# Patient Record
Sex: Female | Born: 1940
Health system: Southern US, Community
[De-identification: ages and names within clinical notes are randomized; demographics above are authoritative.]

---

## 2000-06-07 ENCOUNTER — Emergency Department (HOSPITAL_COMMUNITY): Admission: EM | Admit: 2000-06-07 | Discharge: 2000-06-07 | Payer: Self-pay | Admitting: Emergency Medicine

## 2001-01-03 ENCOUNTER — Encounter: Payer: Self-pay | Admitting: Family Medicine

## 2001-01-03 ENCOUNTER — Encounter: Admission: RE | Admit: 2001-01-03 | Discharge: 2001-01-03 | Payer: Self-pay | Admitting: Family Medicine

## 2001-08-28 ENCOUNTER — Encounter: Admission: RE | Admit: 2001-08-28 | Discharge: 2001-08-28 | Payer: Self-pay | Admitting: Family Medicine

## 2001-08-28 ENCOUNTER — Encounter: Payer: Self-pay | Admitting: Family Medicine

## 2002-01-30 ENCOUNTER — Encounter: Payer: Self-pay | Admitting: Family Medicine

## 2002-01-30 ENCOUNTER — Encounter: Admission: RE | Admit: 2002-01-30 | Discharge: 2002-01-30 | Payer: Self-pay | Admitting: Family Medicine

## 2003-05-30 ENCOUNTER — Encounter: Admission: RE | Admit: 2003-05-30 | Discharge: 2003-05-30 | Payer: Self-pay | Admitting: Family Medicine

## 2003-11-07 ENCOUNTER — Other Ambulatory Visit: Admission: RE | Admit: 2003-11-07 | Discharge: 2003-11-07 | Payer: Self-pay | Admitting: Family Medicine

## 2003-12-11 ENCOUNTER — Ambulatory Visit (HOSPITAL_COMMUNITY): Admission: RE | Admit: 2003-12-11 | Discharge: 2003-12-11 | Payer: Self-pay | Admitting: Gastroenterology

## 2003-12-11 ENCOUNTER — Encounter (INDEPENDENT_AMBULATORY_CARE_PROVIDER_SITE_OTHER): Payer: Self-pay | Admitting: Specialist

## 2004-06-02 ENCOUNTER — Encounter: Admission: RE | Admit: 2004-06-02 | Discharge: 2004-06-02 | Payer: Self-pay | Admitting: Family Medicine

## 2004-11-27 ENCOUNTER — Other Ambulatory Visit: Admission: RE | Admit: 2004-11-27 | Discharge: 2004-11-27 | Payer: Self-pay | Admitting: Family Medicine

## 2005-06-17 ENCOUNTER — Encounter: Admission: RE | Admit: 2005-06-17 | Discharge: 2005-06-17 | Payer: Self-pay | Admitting: Family Medicine

## 2005-06-29 ENCOUNTER — Other Ambulatory Visit: Admission: RE | Admit: 2005-06-29 | Discharge: 2005-06-29 | Payer: Self-pay | Admitting: Physician Assistant

## 2005-12-02 ENCOUNTER — Other Ambulatory Visit: Admission: RE | Admit: 2005-12-02 | Discharge: 2005-12-02 | Payer: Self-pay | Admitting: Family Medicine

## 2006-06-20 ENCOUNTER — Encounter: Admission: RE | Admit: 2006-06-20 | Discharge: 2006-06-20 | Payer: Self-pay | Admitting: Family Medicine

## 2006-12-06 ENCOUNTER — Other Ambulatory Visit: Admission: RE | Admit: 2006-12-06 | Discharge: 2006-12-06 | Payer: Self-pay | Admitting: Family Medicine

## 2007-06-23 ENCOUNTER — Encounter: Admission: RE | Admit: 2007-06-23 | Discharge: 2007-06-23 | Payer: Self-pay | Admitting: Family Medicine

## 2007-12-19 ENCOUNTER — Other Ambulatory Visit: Admission: RE | Admit: 2007-12-19 | Discharge: 2007-12-19 | Payer: Self-pay | Admitting: Family Medicine

## 2008-06-27 ENCOUNTER — Encounter: Admission: RE | Admit: 2008-06-27 | Discharge: 2008-06-27 | Payer: Self-pay | Admitting: Family Medicine

## 2009-01-30 ENCOUNTER — Encounter: Admission: RE | Admit: 2009-01-30 | Discharge: 2009-01-30 | Payer: Self-pay | Admitting: Family Medicine

## 2009-07-08 ENCOUNTER — Encounter: Admission: RE | Admit: 2009-07-08 | Discharge: 2009-07-08 | Payer: Self-pay | Admitting: Family Medicine

## 2010-05-25 ENCOUNTER — Encounter
Admission: RE | Admit: 2010-05-25 | Discharge: 2010-05-25 | Payer: Self-pay | Source: Home / Self Care | Attending: General Surgery | Admitting: General Surgery

## 2010-07-13 ENCOUNTER — Encounter
Admission: RE | Admit: 2010-07-13 | Discharge: 2010-07-13 | Payer: Self-pay | Source: Home / Self Care | Attending: Family Medicine | Admitting: Family Medicine

## 2010-10-30 NOTE — Op Note (Signed)
NAME:  Jennifer Valenzuela, Jennifer Valenzuela                         ACCOUNT NO.:  [id number]   MEDICAL RECORD NO.:  [id number]                   PATIENT TYPE:  AMB   LOCATION:  ENDO                                 FACILITY:  [facility] [place] [facility]   PHYSICIAN:  Graylin Shiver, M.D.                DATE OF BIRTH:  02-Jan-1941   DATE OF PROCEDURE:  12/11/2003  DATE OF DISCHARGE:                                 OPERATIVE REPORT   PROCEDURE:  Colonoscopy with polypectomy.   INDICATION FOR PROCEDURE:  Rectal bleeding, family history of colon polyps.   Informed consent was obtained after explanation of the risks of bleeding,  infection, or perforation.   PREMEDICATIONS:  Fentanyl 50 mcg IV, Versed 6 mg IV.   DESCRIPTION OF PROCEDURE:  With the patient in the left lateral decubitus  position a rectal exam was performed and no masses were felt.  The Olympus  colonoscope was inserted into the rectum and advanced around the colon to  the cecum, cecal landmarks were identified.  The cecum and ascending colon  were normal.  The transverse colon was normal.  The descending colon  revealed an 8-mm pedunculated polyp which was snared and removed by snare  cautery technique.  The polyp was retrieved and the cautery site looked  good.  The sigmoid revealed moderate diverticulosis.  The rectum was normal.  She tolerated the procedure well without complications.   IMPRESSION:  1. Descending colon polyp.  2. Diverticulosis of the sigmoid.   PLAN:  The pathology will be checked.                                               Graylin Shiver, M.D.    Germain Osgood  D:  12/11/2003  T:  12/11/2003  Job:  [id number]   cc:   Schuyler Amor, M.D.  [address]  [place], [place] [address]  Fax: [phone]

## 2011-06-17 ENCOUNTER — Other Ambulatory Visit: Payer: Self-pay | Admitting: Family Medicine

## 2011-06-17 DIAGNOSIS — Z1231 Encounter for screening mammogram for malignant neoplasm of breast: Secondary | ICD-10-CM

## 2011-07-15 ENCOUNTER — Ambulatory Visit
Admission: RE | Admit: 2011-07-15 | Discharge: 2011-07-15 | Disposition: A | Discharge status: Home / Self Care | Payer: Medicare Other | Source: Ambulatory Visit | Attending: Family Medicine | Admitting: Family Medicine

## 2011-07-15 DIAGNOSIS — Z1231 Encounter for screening mammogram for malignant neoplasm of breast: Secondary | ICD-10-CM

## 2011-12-09 DIAGNOSIS — J329 Chronic sinusitis, unspecified: Secondary | ICD-10-CM | POA: Diagnosis not present

## 2011-12-09 DIAGNOSIS — R51 Headache: Secondary | ICD-10-CM | POA: Diagnosis not present

## 2011-12-14 DIAGNOSIS — J329 Chronic sinusitis, unspecified: Secondary | ICD-10-CM | POA: Diagnosis not present

## 2011-12-14 DIAGNOSIS — I10 Essential (primary) hypertension: Secondary | ICD-10-CM | POA: Diagnosis not present

## 2011-12-28 DIAGNOSIS — J019 Acute sinusitis, unspecified: Secondary | ICD-10-CM | POA: Diagnosis not present

## 2011-12-28 DIAGNOSIS — J301 Allergic rhinitis due to pollen: Secondary | ICD-10-CM | POA: Diagnosis not present

## 2011-12-28 DIAGNOSIS — J342 Deviated nasal septum: Secondary | ICD-10-CM | POA: Diagnosis not present

## 2012-01-11 DIAGNOSIS — K219 Gastro-esophageal reflux disease without esophagitis: Secondary | ICD-10-CM | POA: Diagnosis not present

## 2012-01-11 DIAGNOSIS — R109 Unspecified abdominal pain: Secondary | ICD-10-CM | POA: Diagnosis not present

## 2012-01-31 DIAGNOSIS — E7800 Pure hypercholesterolemia, unspecified: Secondary | ICD-10-CM | POA: Diagnosis not present

## 2012-01-31 DIAGNOSIS — K3189 Other diseases of stomach and duodenum: Secondary | ICD-10-CM | POA: Diagnosis not present

## 2012-01-31 DIAGNOSIS — I10 Essential (primary) hypertension: Secondary | ICD-10-CM | POA: Diagnosis not present

## 2012-01-31 DIAGNOSIS — Z79899 Other long term (current) drug therapy: Secondary | ICD-10-CM | POA: Diagnosis not present

## 2012-01-31 DIAGNOSIS — R1013 Epigastric pain: Secondary | ICD-10-CM | POA: Diagnosis not present

## 2012-01-31 DIAGNOSIS — Z0000 Encounter for general adult medical examination without abnormal findings: Secondary | ICD-10-CM | POA: Diagnosis not present

## 2012-03-28 DIAGNOSIS — Z23 Encounter for immunization: Secondary | ICD-10-CM | POA: Diagnosis not present

## 2012-04-26 DIAGNOSIS — E785 Hyperlipidemia, unspecified: Secondary | ICD-10-CM | POA: Diagnosis not present

## 2012-06-19 ENCOUNTER — Other Ambulatory Visit: Payer: Self-pay | Admitting: Family Medicine

## 2012-06-19 DIAGNOSIS — Z1231 Encounter for screening mammogram for malignant neoplasm of breast: Secondary | ICD-10-CM

## 2012-06-27 DIAGNOSIS — H2510 Age-related nuclear cataract, unspecified eye: Secondary | ICD-10-CM | POA: Diagnosis not present

## 2012-06-27 DIAGNOSIS — H353190 Nonexudative age-related macular degeneration, unspecified eye, stage unspecified: Secondary | ICD-10-CM | POA: Diagnosis not present

## 2012-06-27 DIAGNOSIS — H40019 Open angle with borderline findings, low risk, unspecified eye: Secondary | ICD-10-CM | POA: Diagnosis not present

## 2012-07-20 ENCOUNTER — Ambulatory Visit
Admission: RE | Admit: 2012-07-20 | Discharge: 2012-07-20 | Disposition: A | Discharge status: Home / Self Care | Payer: Medicare Other | Source: Ambulatory Visit | Attending: Family Medicine | Admitting: Family Medicine

## 2012-07-20 DIAGNOSIS — Z1231 Encounter for screening mammogram for malignant neoplasm of breast: Secondary | ICD-10-CM

## 2012-08-03 DIAGNOSIS — Z23 Encounter for immunization: Secondary | ICD-10-CM | POA: Diagnosis not present

## 2012-08-03 DIAGNOSIS — E7800 Pure hypercholesterolemia, unspecified: Secondary | ICD-10-CM | POA: Diagnosis not present

## 2012-08-03 DIAGNOSIS — M545 Low back pain: Secondary | ICD-10-CM | POA: Diagnosis not present

## 2012-08-03 DIAGNOSIS — D649 Anemia, unspecified: Secondary | ICD-10-CM | POA: Diagnosis not present

## 2012-08-03 DIAGNOSIS — I10 Essential (primary) hypertension: Secondary | ICD-10-CM | POA: Diagnosis not present

## 2012-08-24 DIAGNOSIS — K649 Unspecified hemorrhoids: Secondary | ICD-10-CM | POA: Diagnosis not present

## 2012-10-03 DIAGNOSIS — H40019 Open angle with borderline findings, low risk, unspecified eye: Secondary | ICD-10-CM | POA: Diagnosis not present

## 2013-02-22 DIAGNOSIS — Z79899 Other long term (current) drug therapy: Secondary | ICD-10-CM | POA: Diagnosis not present

## 2013-02-22 DIAGNOSIS — I10 Essential (primary) hypertension: Secondary | ICD-10-CM | POA: Diagnosis not present

## 2013-02-22 DIAGNOSIS — K649 Unspecified hemorrhoids: Secondary | ICD-10-CM | POA: Diagnosis not present

## 2013-02-22 DIAGNOSIS — Z0000 Encounter for general adult medical examination without abnormal findings: Secondary | ICD-10-CM | POA: Diagnosis not present

## 2013-02-22 DIAGNOSIS — M899 Disorder of bone, unspecified: Secondary | ICD-10-CM | POA: Diagnosis not present

## 2013-02-22 DIAGNOSIS — K219 Gastro-esophageal reflux disease without esophagitis: Secondary | ICD-10-CM | POA: Diagnosis not present

## 2013-02-22 DIAGNOSIS — J301 Allergic rhinitis due to pollen: Secondary | ICD-10-CM | POA: Diagnosis not present

## 2013-02-22 DIAGNOSIS — K3189 Other diseases of stomach and duodenum: Secondary | ICD-10-CM | POA: Diagnosis not present

## 2013-03-07 DIAGNOSIS — Z23 Encounter for immunization: Secondary | ICD-10-CM | POA: Diagnosis not present

## 2013-06-16 DIAGNOSIS — J309 Allergic rhinitis, unspecified: Secondary | ICD-10-CM | POA: Diagnosis not present

## 2013-06-16 DIAGNOSIS — K219 Gastro-esophageal reflux disease without esophagitis: Secondary | ICD-10-CM | POA: Diagnosis not present

## 2013-06-19 ENCOUNTER — Other Ambulatory Visit: Payer: Self-pay

## 2013-06-19 DIAGNOSIS — Z1231 Encounter for screening mammogram for malignant neoplasm of breast: Secondary | ICD-10-CM

## 2013-06-28 DIAGNOSIS — M549 Dorsalgia, unspecified: Secondary | ICD-10-CM | POA: Diagnosis not present

## 2013-06-28 DIAGNOSIS — R1013 Epigastric pain: Secondary | ICD-10-CM | POA: Diagnosis not present

## 2013-06-28 DIAGNOSIS — K649 Unspecified hemorrhoids: Secondary | ICD-10-CM | POA: Diagnosis not present

## 2013-07-16 DIAGNOSIS — M48061 Spinal stenosis, lumbar region without neurogenic claudication: Secondary | ICD-10-CM | POA: Diagnosis not present

## 2013-07-23 DIAGNOSIS — M48061 Spinal stenosis, lumbar region without neurogenic claudication: Secondary | ICD-10-CM | POA: Diagnosis not present

## 2013-07-24 ENCOUNTER — Ambulatory Visit
Admission: RE | Admit: 2013-07-24 | Discharge: 2013-07-24 | Disposition: A | Discharge status: Home / Self Care | Payer: Medicare Other | Source: Ambulatory Visit

## 2013-07-24 DIAGNOSIS — Z1231 Encounter for screening mammogram for malignant neoplasm of breast: Secondary | ICD-10-CM

## 2013-08-08 DIAGNOSIS — M48061 Spinal stenosis, lumbar region without neurogenic claudication: Secondary | ICD-10-CM | POA: Diagnosis not present

## 2013-08-21 DIAGNOSIS — I10 Essential (primary) hypertension: Secondary | ICD-10-CM | POA: Diagnosis not present

## 2013-08-21 DIAGNOSIS — M48061 Spinal stenosis, lumbar region without neurogenic claudication: Secondary | ICD-10-CM | POA: Diagnosis not present

## 2013-08-21 DIAGNOSIS — K921 Melena: Secondary | ICD-10-CM | POA: Diagnosis not present

## 2013-08-21 DIAGNOSIS — E7800 Pure hypercholesterolemia, unspecified: Secondary | ICD-10-CM | POA: Diagnosis not present

## 2013-08-30 DIAGNOSIS — K625 Hemorrhage of anus and rectum: Secondary | ICD-10-CM | POA: Diagnosis not present

## 2013-08-30 DIAGNOSIS — Z86010 Personal history of colonic polyps: Secondary | ICD-10-CM | POA: Diagnosis not present

## 2013-10-03 DIAGNOSIS — K5730 Diverticulosis of large intestine without perforation or abscess without bleeding: Secondary | ICD-10-CM | POA: Diagnosis not present

## 2013-10-03 DIAGNOSIS — K648 Other hemorrhoids: Secondary | ICD-10-CM | POA: Diagnosis not present

## 2013-10-03 DIAGNOSIS — K625 Hemorrhage of anus and rectum: Secondary | ICD-10-CM | POA: Diagnosis not present

## 2013-10-26 DIAGNOSIS — L02818 Cutaneous abscess of other sites: Secondary | ICD-10-CM | POA: Diagnosis not present

## 2013-10-26 DIAGNOSIS — L03818 Cellulitis of other sites: Secondary | ICD-10-CM | POA: Diagnosis not present

## 2014-03-12 DIAGNOSIS — M949 Disorder of cartilage, unspecified: Secondary | ICD-10-CM | POA: Diagnosis not present

## 2014-03-12 DIAGNOSIS — K219 Gastro-esophageal reflux disease without esophagitis: Secondary | ICD-10-CM | POA: Diagnosis not present

## 2014-03-12 DIAGNOSIS — I10 Essential (primary) hypertension: Secondary | ICD-10-CM | POA: Diagnosis not present

## 2014-03-12 DIAGNOSIS — Z79899 Other long term (current) drug therapy: Secondary | ICD-10-CM | POA: Diagnosis not present

## 2014-03-12 DIAGNOSIS — Z0000 Encounter for general adult medical examination without abnormal findings: Secondary | ICD-10-CM | POA: Diagnosis not present

## 2014-03-12 DIAGNOSIS — E7800 Pure hypercholesterolemia, unspecified: Secondary | ICD-10-CM | POA: Diagnosis not present

## 2014-03-12 DIAGNOSIS — M899 Disorder of bone, unspecified: Secondary | ICD-10-CM | POA: Diagnosis not present

## 2014-03-12 DIAGNOSIS — Z23 Encounter for immunization: Secondary | ICD-10-CM | POA: Diagnosis not present

## 2014-07-03 DIAGNOSIS — H35033 Hypertensive retinopathy, bilateral: Secondary | ICD-10-CM | POA: Diagnosis not present

## 2014-07-03 DIAGNOSIS — H524 Presbyopia: Secondary | ICD-10-CM | POA: Diagnosis not present

## 2014-07-03 DIAGNOSIS — H2513 Age-related nuclear cataract, bilateral: Secondary | ICD-10-CM | POA: Diagnosis not present

## 2014-07-03 DIAGNOSIS — H40013 Open angle with borderline findings, low risk, bilateral: Secondary | ICD-10-CM | POA: Diagnosis not present

## 2014-07-04 ENCOUNTER — Other Ambulatory Visit: Payer: Self-pay

## 2014-07-04 DIAGNOSIS — Z1231 Encounter for screening mammogram for malignant neoplasm of breast: Secondary | ICD-10-CM

## 2014-08-01 DIAGNOSIS — J301 Allergic rhinitis due to pollen: Secondary | ICD-10-CM | POA: Diagnosis not present

## 2014-08-01 DIAGNOSIS — I10 Essential (primary) hypertension: Secondary | ICD-10-CM | POA: Diagnosis not present

## 2014-08-07 ENCOUNTER — Ambulatory Visit
Admission: RE | Admit: 2014-08-07 | Discharge: 2014-08-07 | Disposition: A | Discharge status: Home / Self Care | Payer: Medicare Other | Source: Ambulatory Visit

## 2014-08-07 DIAGNOSIS — Z1231 Encounter for screening mammogram for malignant neoplasm of breast: Secondary | ICD-10-CM | POA: Diagnosis not present

## 2014-08-13 DIAGNOSIS — R109 Unspecified abdominal pain: Secondary | ICD-10-CM | POA: Diagnosis not present

## 2014-09-02 DIAGNOSIS — R109 Unspecified abdominal pain: Secondary | ICD-10-CM | POA: Diagnosis not present

## 2014-09-02 DIAGNOSIS — K640 First degree hemorrhoids: Secondary | ICD-10-CM | POA: Diagnosis not present

## 2014-09-04 DIAGNOSIS — H40013 Open angle with borderline findings, low risk, bilateral: Secondary | ICD-10-CM | POA: Diagnosis not present

## 2015-03-17 DIAGNOSIS — Z23 Encounter for immunization: Secondary | ICD-10-CM | POA: Diagnosis not present

## 2015-05-01 DIAGNOSIS — K921 Melena: Secondary | ICD-10-CM | POA: Diagnosis not present

## 2015-05-01 DIAGNOSIS — Z23 Encounter for immunization: Secondary | ICD-10-CM | POA: Diagnosis not present

## 2015-05-01 DIAGNOSIS — M4806 Spinal stenosis, lumbar region: Secondary | ICD-10-CM | POA: Diagnosis not present

## 2015-05-01 DIAGNOSIS — Z0000 Encounter for general adult medical examination without abnormal findings: Secondary | ICD-10-CM | POA: Diagnosis not present

## 2015-05-01 DIAGNOSIS — Z79899 Other long term (current) drug therapy: Secondary | ICD-10-CM | POA: Diagnosis not present

## 2015-05-01 DIAGNOSIS — M899 Disorder of bone, unspecified: Secondary | ICD-10-CM | POA: Diagnosis not present

## 2015-05-01 DIAGNOSIS — I10 Essential (primary) hypertension: Secondary | ICD-10-CM | POA: Diagnosis not present

## 2015-07-03 DIAGNOSIS — I10 Essential (primary) hypertension: Secondary | ICD-10-CM | POA: Diagnosis not present

## 2015-07-17 ENCOUNTER — Other Ambulatory Visit: Payer: Self-pay

## 2015-07-17 DIAGNOSIS — Z1231 Encounter for screening mammogram for malignant neoplasm of breast: Secondary | ICD-10-CM

## 2015-08-12 ENCOUNTER — Ambulatory Visit
Admission: RE | Admit: 2015-08-12 | Discharge: 2015-08-12 | Disposition: A | Discharge status: Home / Self Care | Payer: Medicare Other | Source: Ambulatory Visit

## 2015-08-12 DIAGNOSIS — Z1231 Encounter for screening mammogram for malignant neoplasm of breast: Secondary | ICD-10-CM

## 2015-09-29 DIAGNOSIS — J329 Chronic sinusitis, unspecified: Secondary | ICD-10-CM | POA: Diagnosis not present

## 2015-12-01 DIAGNOSIS — H35033 Hypertensive retinopathy, bilateral: Secondary | ICD-10-CM | POA: Diagnosis not present

## 2016-02-20 DIAGNOSIS — R109 Unspecified abdominal pain: Secondary | ICD-10-CM | POA: Diagnosis not present

## 2016-02-20 DIAGNOSIS — F4320 Adjustment disorder, unspecified: Secondary | ICD-10-CM | POA: Diagnosis not present

## 2016-02-20 DIAGNOSIS — J329 Chronic sinusitis, unspecified: Secondary | ICD-10-CM | POA: Diagnosis not present

## 2016-03-09 DIAGNOSIS — Z23 Encounter for immunization: Secondary | ICD-10-CM | POA: Diagnosis not present

## 2016-05-18 DIAGNOSIS — M8580 Other specified disorders of bone density and structure, unspecified site: Secondary | ICD-10-CM | POA: Diagnosis not present

## 2016-05-18 DIAGNOSIS — Z79899 Other long term (current) drug therapy: Secondary | ICD-10-CM | POA: Diagnosis not present

## 2016-05-18 DIAGNOSIS — E7800 Pure hypercholesterolemia, unspecified: Secondary | ICD-10-CM | POA: Diagnosis not present

## 2016-05-18 DIAGNOSIS — Z0000 Encounter for general adult medical examination without abnormal findings: Secondary | ICD-10-CM | POA: Diagnosis not present

## 2016-05-18 DIAGNOSIS — R0989 Other specified symptoms and signs involving the circulatory and respiratory systems: Secondary | ICD-10-CM | POA: Diagnosis not present

## 2016-05-18 DIAGNOSIS — I10 Essential (primary) hypertension: Secondary | ICD-10-CM | POA: Diagnosis not present

## 2016-05-19 ENCOUNTER — Other Ambulatory Visit: Payer: Self-pay | Admitting: Family Medicine

## 2016-05-19 DIAGNOSIS — R0989 Other specified symptoms and signs involving the circulatory and respiratory systems: Secondary | ICD-10-CM

## 2016-05-26 ENCOUNTER — Ambulatory Visit
Admission: RE | Admit: 2016-05-26 | Discharge: 2016-05-26 | Disposition: A | Discharge status: Home / Self Care | Payer: Medicare Other | Source: Ambulatory Visit | Attending: Family Medicine | Admitting: Family Medicine

## 2016-05-26 DIAGNOSIS — R0989 Other specified symptoms and signs involving the circulatory and respiratory systems: Secondary | ICD-10-CM

## 2016-05-26 DIAGNOSIS — I6523 Occlusion and stenosis of bilateral carotid arteries: Secondary | ICD-10-CM | POA: Diagnosis not present

## 2016-07-22 ENCOUNTER — Other Ambulatory Visit: Payer: Self-pay | Admitting: Family Medicine

## 2016-07-22 DIAGNOSIS — Z1231 Encounter for screening mammogram for malignant neoplasm of breast: Secondary | ICD-10-CM

## 2016-07-22 DIAGNOSIS — E2839 Other primary ovarian failure: Secondary | ICD-10-CM

## 2016-08-13 ENCOUNTER — Ambulatory Visit
Admission: RE | Admit: 2016-08-13 | Discharge: 2016-08-13 | Disposition: A | Discharge status: Home / Self Care | Payer: Medicare Other | Source: Ambulatory Visit | Attending: Family Medicine | Admitting: Family Medicine

## 2016-08-13 DIAGNOSIS — E2839 Other primary ovarian failure: Secondary | ICD-10-CM

## 2016-08-13 DIAGNOSIS — Z1231 Encounter for screening mammogram for malignant neoplasm of breast: Secondary | ICD-10-CM

## 2016-08-13 DIAGNOSIS — Z780 Asymptomatic menopausal state: Secondary | ICD-10-CM | POA: Diagnosis not present

## 2016-08-13 DIAGNOSIS — M810 Age-related osteoporosis without current pathological fracture: Secondary | ICD-10-CM | POA: Diagnosis not present

## 2016-08-20 DIAGNOSIS — E785 Hyperlipidemia, unspecified: Secondary | ICD-10-CM | POA: Diagnosis not present

## 2016-11-09 DIAGNOSIS — S70369A Insect bite (nonvenomous), unspecified thigh, initial encounter: Secondary | ICD-10-CM | POA: Diagnosis not present

## 2016-11-09 DIAGNOSIS — W57XXXA Bitten or stung by nonvenomous insect and other nonvenomous arthropods, initial encounter: Secondary | ICD-10-CM | POA: Diagnosis not present

## 2017-03-11 DIAGNOSIS — Z23 Encounter for immunization: Secondary | ICD-10-CM | POA: Diagnosis not present

## 2017-06-02 DIAGNOSIS — Z79899 Other long term (current) drug therapy: Secondary | ICD-10-CM | POA: Diagnosis not present

## 2017-06-02 DIAGNOSIS — E7800 Pure hypercholesterolemia, unspecified: Secondary | ICD-10-CM | POA: Diagnosis not present

## 2017-06-03 DIAGNOSIS — M48061 Spinal stenosis, lumbar region without neurogenic claudication: Secondary | ICD-10-CM | POA: Diagnosis not present

## 2017-06-03 DIAGNOSIS — J301 Allergic rhinitis due to pollen: Secondary | ICD-10-CM | POA: Diagnosis not present

## 2017-06-03 DIAGNOSIS — I10 Essential (primary) hypertension: Secondary | ICD-10-CM | POA: Diagnosis not present

## 2017-06-03 DIAGNOSIS — E7800 Pure hypercholesterolemia, unspecified: Secondary | ICD-10-CM | POA: Diagnosis not present

## 2017-06-03 DIAGNOSIS — Z0000 Encounter for general adult medical examination without abnormal findings: Secondary | ICD-10-CM | POA: Diagnosis not present

## 2017-06-03 DIAGNOSIS — Z1389 Encounter for screening for other disorder: Secondary | ICD-10-CM | POA: Diagnosis not present

## 2017-06-03 DIAGNOSIS — M899 Disorder of bone, unspecified: Secondary | ICD-10-CM | POA: Diagnosis not present

## 2017-07-18 ENCOUNTER — Other Ambulatory Visit: Payer: Self-pay | Admitting: Family Medicine

## 2017-07-18 DIAGNOSIS — Z1231 Encounter for screening mammogram for malignant neoplasm of breast: Secondary | ICD-10-CM

## 2017-08-19 ENCOUNTER — Ambulatory Visit
Admission: RE | Admit: 2017-08-19 | Discharge: 2017-08-19 | Disposition: A | Discharge status: Home / Self Care | Payer: Medicare Other | Source: Ambulatory Visit | Attending: Family Medicine | Admitting: Family Medicine

## 2017-08-19 DIAGNOSIS — Z1231 Encounter for screening mammogram for malignant neoplasm of breast: Secondary | ICD-10-CM | POA: Diagnosis not present

## 2017-09-20 DIAGNOSIS — H35033 Hypertensive retinopathy, bilateral: Secondary | ICD-10-CM | POA: Diagnosis not present

## 2017-10-31 DIAGNOSIS — J329 Chronic sinusitis, unspecified: Secondary | ICD-10-CM | POA: Diagnosis not present

## 2017-11-17 DIAGNOSIS — S61309A Unspecified open wound of unspecified finger with damage to nail, initial encounter: Secondary | ICD-10-CM | POA: Diagnosis not present

## 2018-02-27 DIAGNOSIS — M4807 Spinal stenosis, lumbosacral region: Secondary | ICD-10-CM | POA: Diagnosis not present

## 2018-03-08 DIAGNOSIS — R300 Dysuria: Secondary | ICD-10-CM | POA: Diagnosis not present

## 2018-03-08 DIAGNOSIS — N765 Ulceration of vagina: Secondary | ICD-10-CM | POA: Diagnosis not present

## 2018-03-13 DIAGNOSIS — I10 Essential (primary) hypertension: Secondary | ICD-10-CM | POA: Diagnosis not present

## 2018-03-13 DIAGNOSIS — M48061 Spinal stenosis, lumbar region without neurogenic claudication: Secondary | ICD-10-CM | POA: Diagnosis not present

## 2018-03-13 DIAGNOSIS — M545 Low back pain: Secondary | ICD-10-CM | POA: Diagnosis not present

## 2018-03-13 DIAGNOSIS — M48062 Spinal stenosis, lumbar region with neurogenic claudication: Secondary | ICD-10-CM | POA: Diagnosis not present

## 2018-03-21 DIAGNOSIS — Z23 Encounter for immunization: Secondary | ICD-10-CM | POA: Diagnosis not present

## 2018-03-23 DIAGNOSIS — M545 Low back pain: Secondary | ICD-10-CM | POA: Diagnosis not present

## 2018-03-28 DIAGNOSIS — M545 Low back pain: Secondary | ICD-10-CM | POA: Diagnosis not present

## 2018-03-28 DIAGNOSIS — M48061 Spinal stenosis, lumbar region without neurogenic claudication: Secondary | ICD-10-CM | POA: Diagnosis not present

## 2018-04-12 DIAGNOSIS — M5416 Radiculopathy, lumbar region: Secondary | ICD-10-CM | POA: Diagnosis not present

## 2018-04-19 DIAGNOSIS — M5416 Radiculopathy, lumbar region: Secondary | ICD-10-CM | POA: Diagnosis not present

## 2018-04-20 DIAGNOSIS — M545 Low back pain: Secondary | ICD-10-CM | POA: Diagnosis not present

## 2018-04-27 DIAGNOSIS — M5416 Radiculopathy, lumbar region: Secondary | ICD-10-CM | POA: Diagnosis not present

## 2018-05-04 DIAGNOSIS — M5416 Radiculopathy, lumbar region: Secondary | ICD-10-CM | POA: Diagnosis not present

## 2018-05-12 ENCOUNTER — Encounter (HOSPITAL_COMMUNITY): Payer: Self-pay | Admitting: Emergency Medicine

## 2018-05-12 ENCOUNTER — Emergency Department (HOSPITAL_COMMUNITY)
Admission: EM | Admit: 2018-05-12 | Discharge: 2018-05-12 | Disposition: A | Discharge status: Home / Self Care | Payer: Medicare Other | Attending: Emergency Medicine | Admitting: Emergency Medicine

## 2018-05-12 DIAGNOSIS — M79604 Pain in right leg: Secondary | ICD-10-CM | POA: Diagnosis present

## 2018-05-12 DIAGNOSIS — M5431 Sciatica, right side: Secondary | ICD-10-CM | POA: Diagnosis not present

## 2018-05-12 MED ORDER — HYDROCODONE-ACETAMINOPHEN 5-325 MG PO TABS: 1.0000 | ORAL_TABLET | Freq: Three times a day (TID) | ORAL | 0 refills | Status: DC | PRN

## 2018-05-12 MED ORDER — HYDROCODONE-ACETAMINOPHEN 5-325 MG PO TABS
1.0000 | ORAL_TABLET | Freq: Once | ORAL | Status: AC
  Administered 2018-05-12: 1 via ORAL
  Filled 2018-05-12: qty 1

## 2018-05-12 NOTE — Discharge Instructions (Addendum)
Continue your daily prescribed medications.  You may use Norco as needed for episodes of severe pain.  Do not drive or drink alcohol after taking this medication as it may make you drowsy and impair your judgment.  Use a cane or walker for stability when walking.  Follow-up with orthopedics for further evaluation of this ongoing issue.  You may return for new or concerning symptoms.

## 2018-05-12 NOTE — ED Triage Notes (Signed)
Pt presents to ED for assessment of chronic knee pain with a delay in cortisone therapy at orthopedic.

## 2018-05-12 NOTE — ED Provider Notes (Signed)
[facility] Provider Note   CSN: [id number] Arrival date & time: 05/12/18  2206    History   Chief Complaint Chief Complaint  Patient presents with  . Knee Pain    HPI Jennifer Valenzuela is a 77 y.o. female.  77 year old female with hx of lumbar spinal stenosis presents to the emergency department for evaluation of pain and stiffness in her right lower extremity.  She states that she has been experiencing this pain for the last few months.  It has been worsening recently.  She has continue taking her prescribed Neurontin, but states this medication is no longer helping.  She was previously on a course of steroids and notes that she is supposed to receive a cortisone injection in her back by [facility].  Denies any fall or trauma.  No genital or peroneal numbness.  Denies bowel or bladder incontinence, fevers.  States the pain is making it difficult for her to sleep; worsening pain is what prompted her visit to the ED tonight.     History reviewed. No pertinent past medical history.  There are no active problems to display for this patient.   History reviewed. No pertinent surgical history.   OB History   None      Home Medications    Prior to Admission medications   Medication Sig Start Date End Date Taking? Authorizing Provider  HYDROcodone-acetaminophen (NORCO/VICODIN) 5-325 MG tablet Take 1 tablet by mouth every 8 (eight) hours as needed for severe pain. 05/12/18   Antony MaduraHumes, Cyncere Sontag, PA-C    Family History Family History  Problem Relation Age of Onset  . Breast cancer Cousin     Social History Social History   Tobacco Use  . Smoking status: Never Smoker  . Smokeless tobacco: Never Used  Substance Use Topics  . Alcohol use: Not Currently    Frequency: Never  . Drug use: Never     Allergies   Patient has no known allergies.   Review of Systems Review of Systems Ten systems reviewed and are negative for acute  change, except as noted in the HPI.    Physical Exam Updated Vital Signs BP (!) 178/102 (BP Location: Right Arm)   Pulse 81   Temp 98 F (36.7 C) (Oral)   Resp 16   SpO2 100%   Physical Exam  Constitutional: She is oriented to person, place, and time. She appears well-developed and well-nourished. No distress.  Nontoxic appearing and in NAD  HENT:  Head: Normocephalic and atraumatic.  Eyes: Conjunctivae and EOM are normal. No scleral icterus.  Neck: Normal range of motion.  Cardiovascular: Normal rate, regular rhythm and intact distal pulses.  DP pulse 2+ in the RLE  Pulmonary/Chest: Effort normal. No stridor. No respiratory distress.  Respirations even and unlabored  Musculoskeletal: Normal range of motion.  No distinct TTP to the RLE.  Normal ROM of the RLE. Positive straight leg raise on the right.   Neurological: She is alert and oriented to person, place, and time. She exhibits normal muscle tone. Coordination normal.  Ambulatory with antalgic gait; minimal assistance. Sensation to light touch intact in BLE.  Skin: Skin is warm and dry. No rash noted. She is not diaphoretic. No erythema. No pallor.  Psychiatric: She has a normal mood and affect. Her behavior is normal.  Nursing note and vitals reviewed.    ED Treatments / Results  Labs (all labs ordered are listed, but only abnormal results are displayed) Labs Reviewed -  No data to display  EKG None  Radiology No results found.  Procedures Procedures (including critical care time)  Medications Ordered in ED Medications  HYDROcodone-acetaminophen (NORCO/VICODIN) 5-325 MG per tablet 1 tablet (1 tablet Oral Given 05/12/18 2253)     Initial Impression / Assessment and Plan / ED Course  I have reviewed the triage vital signs and the nursing notes.  Pertinent labs & imaging results that were available during my care of the patient were reviewed by me and considered in my medical decision making (see chart for  details).     77 year old female presents for worsening radicular pain with right lower extremity sciatica.  She is neurovascularly intact on exam.  Denies recent trauma or injury, fall, fevers.  She has not had any bowel or bladder incontinence.  Was previously on a course of Neurontin, but states this is no longer helping her.  Had an MRI approximately 1 month ago for evaluation of the symptoms.  Is actively followed by orthopedics.  I have recommended that she contact her orthopedist for outpatient follow-up.  Will start on a short course of Norco for pain control.  Discussed restrictions and driving precautions.  Encouraged the use of a cane or walker for stability.  Return precautions discussed and provided. Patient discharged in stable condition with no unaddressed concerns.   Final Clinical Impressions(s) / ED Diagnoses   Final diagnoses:  Sciatica of right side    ED Discharge Orders         Ordered    HYDROcodone-acetaminophen (NORCO/VICODIN) 5-325 MG tablet  Every 8 hours PRN     05/12/18 2230           Antony Madura, PA-C 05/12/18 2308    Rolan Bucco, MD 05/12/18 2312

## 2018-05-19 DIAGNOSIS — M5137 Other intervertebral disc degeneration, lumbosacral region: Secondary | ICD-10-CM | POA: Diagnosis not present

## 2018-05-22 DIAGNOSIS — M5416 Radiculopathy, lumbar region: Secondary | ICD-10-CM | POA: Diagnosis not present

## 2018-05-24 DIAGNOSIS — M5416 Radiculopathy, lumbar region: Secondary | ICD-10-CM | POA: Diagnosis not present

## 2018-05-29 ENCOUNTER — Emergency Department (HOSPITAL_COMMUNITY)
Admission: EM | Admit: 2018-05-29 | Discharge: 2018-05-29 | Disposition: A | Discharge status: Home / Self Care | Payer: Medicare Other | Attending: Emergency Medicine | Admitting: Emergency Medicine

## 2018-05-29 DIAGNOSIS — M545 Low back pain: Secondary | ICD-10-CM | POA: Diagnosis present

## 2018-05-29 DIAGNOSIS — M5431 Sciatica, right side: Secondary | ICD-10-CM | POA: Diagnosis not present

## 2018-05-29 DIAGNOSIS — M5441 Lumbago with sciatica, right side: Secondary | ICD-10-CM | POA: Diagnosis not present

## 2018-05-29 MED ORDER — HYDROCODONE-ACETAMINOPHEN 5-325 MG PO TABS
1.0000 | ORAL_TABLET | Freq: Once | ORAL | Status: AC
  Administered 2018-05-29: 1 via ORAL
  Filled 2018-05-29: qty 1

## 2018-05-29 MED ORDER — HYDROCODONE-ACETAMINOPHEN 5-325 MG PO TABS: 1.0000 | ORAL_TABLET | Freq: Four times a day (QID) | ORAL | 0 refills | Status: DC | PRN

## 2018-05-29 NOTE — ED Provider Notes (Signed)
[facility] EMERGENCY DEPARTMENT Provider Note   CSN: [id number] Arrival date & time: 05/29/18  0909     History   Chief Complaint Chief Complaint  Patient presents with  . Back Pain    HPI Jennifer Valenzuela is a 77 y.o. female.  HPI   77 year old female presents today with complaints of back pain.  Patient notes chronic right low back and hip pain since August 2019.  She is currently followed by Dr. Horald Chestnut who is doing steroid injections.  She notes that pain has become severe with significant pain with sleeping, ambulating.  She is taking Aleve with no significant improvement in symptoms.  She reports on 6 December she had a cortisone injection.  She denies any loss of strength sensation or motor function of the lower extremity but notes that lifting the leg up causes severe pain.  Patient has taken hydrocodone with some minor relief of symptoms.  Any nausea vomiting or abdominal pain, denies any fever.  No recent trauma.  Patient has had plain films and MRI with no surgical findings.  No past medical history on file.  There are no active problems to display for this patient.   No past surgical history on file.   OB History   No obstetric history on file.      Home Medications    Prior to Admission medications   Medication Sig Start Date End Date Taking? Authorizing Provider  HYDROcodone-acetaminophen (NORCO/VICODIN) 5-325 MG tablet Take 1 tablet by mouth every 6 (six) hours as needed. 05/29/18   Eyvonne Mechanic, PA-C    Family History Family History  Problem Relation Age of Onset  . Breast cancer Cousin     Social History Social History   Tobacco Use  . Smoking status: Never Smoker  . Smokeless tobacco: Never Used  Substance Use Topics  . Alcohol use: Not Currently    Frequency: Never  . Drug use: Never     Allergies   Patient has no known allergies.   Review of Systems Review of Systems  All other systems reviewed and are  negative.    Physical Exam Updated Vital Signs BP (!) 155/81 (BP Location: Right Arm)   Pulse 78   Temp 99.2 F (37.3 C) (Oral)   Resp 16   SpO2 100%   Physical Exam Vitals signs and nursing note reviewed.  Constitutional:      Appearance: She is well-developed.  HENT:     Head: Normocephalic and atraumatic.  Eyes:     General: No scleral icterus.       Right eye: No discharge.        Left eye: No discharge.     Conjunctiva/sclera: Conjunctivae normal.     Pupils: Pupils are equal, round, and reactive to light.  Neck:     Musculoskeletal: Normal range of motion.     Vascular: No JVD.     Trachea: No tracheal deviation.  Pulmonary:     Effort: Pulmonary effort is normal.     Breath sounds: No stridor.  Abdominal:     Comments: Abdomen soft nontender  Musculoskeletal:     Comments: Tenderness palpation of the right upper gluteus and lateral hip, straight leg positive, distal sensation strength and motor function intact pedal pulse 2+  Neurological:     Mental Status: She is alert and oriented to person, place, and time.     Coordination: Coordination normal.  Psychiatric:        Behavior:  Behavior normal.        Thought Content: Thought content normal.        Judgment: Judgment normal.      ED Treatments / Results  Labs (all labs ordered are listed, but only abnormal results are displayed) Labs Reviewed - No data to display  EKG None  Radiology No results found.  Procedures Procedures (including critical care time)  Medications Ordered in ED Medications  HYDROcodone-acetaminophen (NORCO/VICODIN) 5-325 MG per tablet 1 tablet (has no administration in time range)     Initial Impression / Assessment and Plan / ED Course  I have reviewed the triage vital signs and the nursing notes.  Pertinent labs & imaging results that were available during my care of the patient were reviewed by me and considered in my medical decision making (see chart for  details).     77 year old female presents today with acutely worsening sciatica.  I do find it reasonable to give the patient 3 tabs of Norco until she can follow-up with outpatient specialist.  She is given strict return precautions counseled on use of medication, patient verbalized understanding and agreement to today's plan.  No red flags here today that require further evaluation or management.  Final Clinical Impressions(s) / ED Diagnoses   Final diagnoses:  Sciatica of right side    ED Discharge Orders         Ordered    HYDROcodone-acetaminophen (NORCO/VICODIN) 5-325 MG tablet  Every 6 hours PRN     05/29/18 0940           Eyvonne MechanicHedges, Chou Busler, PA-C 05/29/18 0940    Rolan BuccoBelfi, Melanie, MD 05/29/18 1626

## 2018-05-29 NOTE — ED Triage Notes (Signed)
Pt POV d/t back pain that started around Aug 2019

## 2018-05-29 NOTE — Discharge Instructions (Addendum)
Please read attached information. If you experience any new or worsening signs or symptoms please return to the emergency room for evaluation. Please follow-up with your primary care provider or specialist as discussed. Please use medication prescribed only as directed and discontinue taking if you have any concerning signs or symptoms.   °

## 2018-06-01 DIAGNOSIS — M5416 Radiculopathy, lumbar region: Secondary | ICD-10-CM | POA: Diagnosis not present

## 2018-06-01 DIAGNOSIS — I10 Essential (primary) hypertension: Secondary | ICD-10-CM | POA: Diagnosis not present

## 2018-06-09 DIAGNOSIS — M5137 Other intervertebral disc degeneration, lumbosacral region: Secondary | ICD-10-CM | POA: Diagnosis not present

## 2018-06-19 DIAGNOSIS — I10 Essential (primary) hypertension: Secondary | ICD-10-CM | POA: Diagnosis not present

## 2018-06-19 DIAGNOSIS — Z6827 Body mass index (BMI) 27.0-27.9, adult: Secondary | ICD-10-CM | POA: Diagnosis not present

## 2018-06-19 DIAGNOSIS — M4726 Other spondylosis with radiculopathy, lumbar region: Secondary | ICD-10-CM | POA: Diagnosis not present

## 2018-06-26 ENCOUNTER — Telehealth: Payer: Self-pay | Admitting: Nurse Practitioner

## 2018-06-26 ENCOUNTER — Other Ambulatory Visit: Payer: Self-pay | Admitting: Neurosurgery

## 2018-06-26 DIAGNOSIS — M4726 Other spondylosis with radiculopathy, lumbar region: Secondary | ICD-10-CM

## 2018-06-26 NOTE — Telephone Encounter (Signed)
Phone call to patient to verify medication list and allergies for myelogram procedure. Pt instructed to hold Tramadol for 48hrs prior to myelogram appointment time. Pt verbalized understanding. 

## 2018-06-30 ENCOUNTER — Ambulatory Visit
Admission: RE | Admit: 2018-06-30 | Discharge: 2018-06-30 | Disposition: A | Discharge status: Home / Self Care | Payer: Medicare Other | Source: Ambulatory Visit | Attending: Neurosurgery | Admitting: Neurosurgery

## 2018-06-30 DIAGNOSIS — M4726 Other spondylosis with radiculopathy, lumbar region: Secondary | ICD-10-CM

## 2018-06-30 DIAGNOSIS — M545 Low back pain: Secondary | ICD-10-CM | POA: Diagnosis not present

## 2018-06-30 MED ORDER — IOPAMIDOL (ISOVUE-M 200) INJECTION 41%
15.0000 mL | Freq: Once | INTRAMUSCULAR | Status: AC
  Administered 2018-06-30: 15 mL via INTRATHECAL

## 2018-06-30 MED ORDER — DIAZEPAM 5 MG PO TABS
5.0000 mg | ORAL_TABLET | Freq: Once | ORAL | Status: AC
  Administered 2018-06-30: 5 mg via ORAL

## 2018-06-30 NOTE — Discharge Instructions (Signed)

## 2018-06-30 NOTE — Progress Notes (Signed)
Pt reports she has been off her tramadol for at least 48 hrs.

## 2018-07-04 DIAGNOSIS — I10 Essential (primary) hypertension: Secondary | ICD-10-CM | POA: Diagnosis not present

## 2018-07-04 DIAGNOSIS — Z6826 Body mass index (BMI) 26.0-26.9, adult: Secondary | ICD-10-CM | POA: Diagnosis not present

## 2018-07-04 DIAGNOSIS — M4726 Other spondylosis with radiculopathy, lumbar region: Secondary | ICD-10-CM | POA: Diagnosis not present

## 2018-07-20 DIAGNOSIS — M48061 Spinal stenosis, lumbar region without neurogenic claudication: Secondary | ICD-10-CM | POA: Diagnosis not present

## 2018-07-20 DIAGNOSIS — Z79899 Other long term (current) drug therapy: Secondary | ICD-10-CM | POA: Diagnosis not present

## 2018-07-20 DIAGNOSIS — E559 Vitamin D deficiency, unspecified: Secondary | ICD-10-CM | POA: Diagnosis not present

## 2018-07-20 DIAGNOSIS — I10 Essential (primary) hypertension: Secondary | ICD-10-CM | POA: Diagnosis not present

## 2018-07-20 DIAGNOSIS — E785 Hyperlipidemia, unspecified: Secondary | ICD-10-CM | POA: Diagnosis not present

## 2018-07-20 DIAGNOSIS — I6523 Occlusion and stenosis of bilateral carotid arteries: Secondary | ICD-10-CM | POA: Diagnosis not present

## 2018-07-20 DIAGNOSIS — Z0000 Encounter for general adult medical examination without abnormal findings: Secondary | ICD-10-CM | POA: Diagnosis not present

## 2018-07-20 DIAGNOSIS — M5416 Radiculopathy, lumbar region: Secondary | ICD-10-CM | POA: Diagnosis not present

## 2018-07-20 DIAGNOSIS — M899 Disorder of bone, unspecified: Secondary | ICD-10-CM | POA: Diagnosis not present

## 2018-08-10 ENCOUNTER — Other Ambulatory Visit: Payer: Self-pay | Admitting: Family Medicine

## 2018-08-10 DIAGNOSIS — E2839 Other primary ovarian failure: Secondary | ICD-10-CM

## 2018-08-15 ENCOUNTER — Other Ambulatory Visit: Payer: Self-pay | Admitting: Family Medicine

## 2018-08-15 DIAGNOSIS — Z1231 Encounter for screening mammogram for malignant neoplasm of breast: Secondary | ICD-10-CM

## 2018-10-20 ENCOUNTER — Other Ambulatory Visit: Payer: Self-pay

## 2018-10-20 ENCOUNTER — Ambulatory Visit: Payer: Self-pay

## 2018-12-27 ENCOUNTER — Ambulatory Visit
Admission: RE | Admit: 2018-12-27 | Discharge: 2018-12-27 | Disposition: A | Discharge status: Home / Self Care | Payer: Medicare Other | Source: Ambulatory Visit | Attending: Family Medicine | Admitting: Family Medicine

## 2018-12-27 DIAGNOSIS — M85852 Other specified disorders of bone density and structure, left thigh: Secondary | ICD-10-CM | POA: Diagnosis not present

## 2018-12-27 DIAGNOSIS — Z1231 Encounter for screening mammogram for malignant neoplasm of breast: Secondary | ICD-10-CM

## 2018-12-27 DIAGNOSIS — Z780 Asymptomatic menopausal state: Secondary | ICD-10-CM | POA: Diagnosis not present

## 2018-12-27 DIAGNOSIS — M810 Age-related osteoporosis without current pathological fracture: Secondary | ICD-10-CM | POA: Diagnosis not present

## 2018-12-27 DIAGNOSIS — E2839 Other primary ovarian failure: Secondary | ICD-10-CM

## 2019-01-29 DIAGNOSIS — Z20828 Contact with and (suspected) exposure to other viral communicable diseases: Secondary | ICD-10-CM | POA: Diagnosis not present

## 2019-02-05 DIAGNOSIS — M25569 Pain in unspecified knee: Secondary | ICD-10-CM | POA: Diagnosis not present

## 2019-02-05 DIAGNOSIS — R0982 Postnasal drip: Secondary | ICD-10-CM | POA: Diagnosis not present

## 2019-02-05 DIAGNOSIS — M5416 Radiculopathy, lumbar region: Secondary | ICD-10-CM | POA: Diagnosis not present

## 2019-02-05 DIAGNOSIS — R140 Abdominal distension (gaseous): Secondary | ICD-10-CM | POA: Diagnosis not present

## 2019-02-05 DIAGNOSIS — J301 Allergic rhinitis due to pollen: Secondary | ICD-10-CM | POA: Diagnosis not present

## 2019-02-05 DIAGNOSIS — Z23 Encounter for immunization: Secondary | ICD-10-CM | POA: Diagnosis not present

## 2019-05-28 DIAGNOSIS — Z832 Family history of diseases of the blood and blood-forming organs and certain disorders involving the immune mechanism: Secondary | ICD-10-CM | POA: Diagnosis not present

## 2019-05-28 DIAGNOSIS — D649 Anemia, unspecified: Secondary | ICD-10-CM | POA: Diagnosis not present

## 2019-05-28 DIAGNOSIS — M810 Age-related osteoporosis without current pathological fracture: Secondary | ICD-10-CM | POA: Diagnosis not present

## 2019-05-28 DIAGNOSIS — R109 Unspecified abdominal pain: Secondary | ICD-10-CM | POA: Diagnosis not present

## 2019-10-18 DIAGNOSIS — I10 Essential (primary) hypertension: Secondary | ICD-10-CM | POA: Diagnosis not present

## 2019-10-18 DIAGNOSIS — E559 Vitamin D deficiency, unspecified: Secondary | ICD-10-CM | POA: Diagnosis not present

## 2019-10-18 DIAGNOSIS — M48061 Spinal stenosis, lumbar region without neurogenic claudication: Secondary | ICD-10-CM | POA: Diagnosis not present

## 2019-10-18 DIAGNOSIS — Z0000 Encounter for general adult medical examination without abnormal findings: Secondary | ICD-10-CM | POA: Diagnosis not present

## 2019-10-18 DIAGNOSIS — E785 Hyperlipidemia, unspecified: Secondary | ICD-10-CM | POA: Diagnosis not present

## 2019-10-18 DIAGNOSIS — J302 Other seasonal allergic rhinitis: Secondary | ICD-10-CM | POA: Diagnosis not present

## 2019-10-18 DIAGNOSIS — R7301 Impaired fasting glucose: Secondary | ICD-10-CM | POA: Diagnosis not present

## 2019-10-18 DIAGNOSIS — K219 Gastro-esophageal reflux disease without esophagitis: Secondary | ICD-10-CM | POA: Diagnosis not present

## 2019-10-18 DIAGNOSIS — Z79899 Other long term (current) drug therapy: Secondary | ICD-10-CM | POA: Diagnosis not present

## 2019-10-18 DIAGNOSIS — R350 Frequency of micturition: Secondary | ICD-10-CM | POA: Diagnosis not present

## 2019-10-18 DIAGNOSIS — M810 Age-related osteoporosis without current pathological fracture: Secondary | ICD-10-CM | POA: Diagnosis not present

## 2019-10-18 DIAGNOSIS — I6523 Occlusion and stenosis of bilateral carotid arteries: Secondary | ICD-10-CM | POA: Diagnosis not present

## 2019-10-24 DIAGNOSIS — J301 Allergic rhinitis due to pollen: Secondary | ICD-10-CM | POA: Diagnosis not present

## 2019-10-24 DIAGNOSIS — J3089 Other allergic rhinitis: Secondary | ICD-10-CM | POA: Diagnosis not present

## 2019-10-24 DIAGNOSIS — J300 Vasomotor rhinitis: Secondary | ICD-10-CM | POA: Diagnosis not present

## 2019-11-06 DIAGNOSIS — M4726 Other spondylosis with radiculopathy, lumbar region: Secondary | ICD-10-CM | POA: Diagnosis not present

## 2019-11-06 DIAGNOSIS — Z6826 Body mass index (BMI) 26.0-26.9, adult: Secondary | ICD-10-CM | POA: Diagnosis not present

## 2019-11-06 DIAGNOSIS — I10 Essential (primary) hypertension: Secondary | ICD-10-CM | POA: Diagnosis not present

## 2019-11-27 DIAGNOSIS — M5416 Radiculopathy, lumbar region: Secondary | ICD-10-CM | POA: Diagnosis not present

## 2019-11-29 DIAGNOSIS — M4726 Other spondylosis with radiculopathy, lumbar region: Secondary | ICD-10-CM | POA: Diagnosis not present

## 2019-12-03 ENCOUNTER — Other Ambulatory Visit: Payer: Self-pay | Admitting: Family Medicine

## 2019-12-03 DIAGNOSIS — Z1231 Encounter for screening mammogram for malignant neoplasm of breast: Secondary | ICD-10-CM

## 2019-12-26 DIAGNOSIS — Z20822 Contact with and (suspected) exposure to covid-19: Secondary | ICD-10-CM | POA: Diagnosis not present

## 2019-12-26 DIAGNOSIS — J0110 Acute frontal sinusitis, unspecified: Secondary | ICD-10-CM | POA: Diagnosis not present

## 2019-12-31 ENCOUNTER — Ambulatory Visit
Admission: RE | Admit: 2019-12-31 | Discharge: 2019-12-31 | Disposition: A | Discharge status: Home / Self Care | Payer: Medicare Other | Source: Ambulatory Visit | Attending: Family Medicine | Admitting: Family Medicine

## 2019-12-31 ENCOUNTER — Other Ambulatory Visit: Payer: Self-pay

## 2019-12-31 DIAGNOSIS — Z1231 Encounter for screening mammogram for malignant neoplasm of breast: Secondary | ICD-10-CM

## 2020-01-04 DIAGNOSIS — J301 Allergic rhinitis due to pollen: Secondary | ICD-10-CM | POA: Diagnosis not present

## 2020-01-04 DIAGNOSIS — J300 Vasomotor rhinitis: Secondary | ICD-10-CM | POA: Diagnosis not present

## 2020-01-04 DIAGNOSIS — J0100 Acute maxillary sinusitis, unspecified: Secondary | ICD-10-CM | POA: Diagnosis not present

## 2020-01-04 DIAGNOSIS — J3089 Other allergic rhinitis: Secondary | ICD-10-CM | POA: Diagnosis not present

## 2020-03-11 DIAGNOSIS — Z23 Encounter for immunization: Secondary | ICD-10-CM | POA: Diagnosis not present

## 2020-03-18 DIAGNOSIS — H35033 Hypertensive retinopathy, bilateral: Secondary | ICD-10-CM | POA: Diagnosis not present

## 2020-04-18 DIAGNOSIS — Z23 Encounter for immunization: Secondary | ICD-10-CM | POA: Diagnosis not present

## 2020-06-23 DIAGNOSIS — M4726 Other spondylosis with radiculopathy, lumbar region: Secondary | ICD-10-CM | POA: Diagnosis not present

## 2020-06-27 DIAGNOSIS — M549 Dorsalgia, unspecified: Secondary | ICD-10-CM | POA: Diagnosis not present

## 2020-06-27 DIAGNOSIS — M810 Age-related osteoporosis without current pathological fracture: Secondary | ICD-10-CM | POA: Diagnosis not present

## 2020-07-02 ENCOUNTER — Other Ambulatory Visit: Payer: Self-pay | Admitting: Internal Medicine

## 2020-07-02 DIAGNOSIS — M810 Age-related osteoporosis without current pathological fracture: Secondary | ICD-10-CM

## 2020-07-08 DIAGNOSIS — M4726 Other spondylosis with radiculopathy, lumbar region: Secondary | ICD-10-CM | POA: Diagnosis not present

## 2020-07-08 DIAGNOSIS — M545 Low back pain, unspecified: Secondary | ICD-10-CM | POA: Diagnosis not present

## 2020-07-14 DIAGNOSIS — Z6825 Body mass index (BMI) 25.0-25.9, adult: Secondary | ICD-10-CM | POA: Diagnosis not present

## 2020-07-14 DIAGNOSIS — I10 Essential (primary) hypertension: Secondary | ICD-10-CM | POA: Diagnosis not present

## 2020-07-14 DIAGNOSIS — M4726 Other spondylosis with radiculopathy, lumbar region: Secondary | ICD-10-CM | POA: Diagnosis not present

## 2020-07-16 DIAGNOSIS — Z86010 Personal history of colonic polyps: Secondary | ICD-10-CM | POA: Diagnosis not present

## 2020-07-16 DIAGNOSIS — K625 Hemorrhage of anus and rectum: Secondary | ICD-10-CM | POA: Diagnosis not present

## 2020-07-16 DIAGNOSIS — K5900 Constipation, unspecified: Secondary | ICD-10-CM | POA: Diagnosis not present

## 2020-07-16 DIAGNOSIS — K649 Unspecified hemorrhoids: Secondary | ICD-10-CM | POA: Diagnosis not present

## 2020-07-16 DIAGNOSIS — Z01812 Encounter for preprocedural laboratory examination: Secondary | ICD-10-CM | POA: Diagnosis not present

## 2020-07-22 DIAGNOSIS — K5730 Diverticulosis of large intestine without perforation or abscess without bleeding: Secondary | ICD-10-CM | POA: Diagnosis not present

## 2020-07-22 DIAGNOSIS — Z86010 Personal history of colonic polyps: Secondary | ICD-10-CM | POA: Diagnosis not present

## 2020-08-17 DIAGNOSIS — B029 Zoster without complications: Secondary | ICD-10-CM | POA: Diagnosis not present

## 2020-08-18 DIAGNOSIS — D509 Iron deficiency anemia, unspecified: Secondary | ICD-10-CM | POA: Diagnosis not present

## 2020-09-04 DIAGNOSIS — K6289 Other specified diseases of anus and rectum: Secondary | ICD-10-CM | POA: Diagnosis not present

## 2020-09-15 DIAGNOSIS — D649 Anemia, unspecified: Secondary | ICD-10-CM | POA: Diagnosis not present

## 2020-09-15 DIAGNOSIS — K649 Unspecified hemorrhoids: Secondary | ICD-10-CM | POA: Diagnosis not present

## 2020-09-15 DIAGNOSIS — K5900 Constipation, unspecified: Secondary | ICD-10-CM | POA: Diagnosis not present

## 2020-09-23 DIAGNOSIS — H2513 Age-related nuclear cataract, bilateral: Secondary | ICD-10-CM | POA: Diagnosis not present

## 2020-09-23 DIAGNOSIS — H35033 Hypertensive retinopathy, bilateral: Secondary | ICD-10-CM | POA: Diagnosis not present

## 2020-11-19 DIAGNOSIS — I10 Essential (primary) hypertension: Secondary | ICD-10-CM | POA: Diagnosis not present

## 2020-11-19 DIAGNOSIS — D509 Iron deficiency anemia, unspecified: Secondary | ICD-10-CM | POA: Diagnosis not present

## 2020-11-19 DIAGNOSIS — Z0000 Encounter for general adult medical examination without abnormal findings: Secondary | ICD-10-CM | POA: Diagnosis not present

## 2020-11-19 DIAGNOSIS — R350 Frequency of micturition: Secondary | ICD-10-CM | POA: Diagnosis not present

## 2020-11-19 DIAGNOSIS — E7800 Pure hypercholesterolemia, unspecified: Secondary | ICD-10-CM | POA: Diagnosis not present

## 2020-11-19 DIAGNOSIS — Z79899 Other long term (current) drug therapy: Secondary | ICD-10-CM | POA: Diagnosis not present

## 2020-11-19 DIAGNOSIS — J301 Allergic rhinitis due to pollen: Secondary | ICD-10-CM | POA: Diagnosis not present

## 2020-11-19 DIAGNOSIS — H918X9 Other specified hearing loss, unspecified ear: Secondary | ICD-10-CM | POA: Diagnosis not present

## 2020-11-19 DIAGNOSIS — I6523 Occlusion and stenosis of bilateral carotid arteries: Secondary | ICD-10-CM | POA: Diagnosis not present

## 2020-11-19 DIAGNOSIS — M818 Other osteoporosis without current pathological fracture: Secondary | ICD-10-CM | POA: Diagnosis not present

## 2020-11-19 DIAGNOSIS — R7303 Prediabetes: Secondary | ICD-10-CM | POA: Diagnosis not present

## 2020-11-27 ENCOUNTER — Other Ambulatory Visit: Payer: Self-pay | Admitting: Family Medicine

## 2020-11-27 DIAGNOSIS — Z1231 Encounter for screening mammogram for malignant neoplasm of breast: Secondary | ICD-10-CM

## 2020-12-29 ENCOUNTER — Ambulatory Visit
Admission: RE | Admit: 2020-12-29 | Discharge: 2020-12-29 | Disposition: A | Discharge status: Home / Self Care | Payer: Medicare Other | Source: Ambulatory Visit | Attending: Internal Medicine | Admitting: Internal Medicine

## 2020-12-29 ENCOUNTER — Other Ambulatory Visit: Payer: Self-pay

## 2020-12-29 DIAGNOSIS — M810 Age-related osteoporosis without current pathological fracture: Secondary | ICD-10-CM

## 2020-12-29 DIAGNOSIS — Z780 Asymptomatic menopausal state: Secondary | ICD-10-CM | POA: Diagnosis not present

## 2020-12-29 DIAGNOSIS — M85852 Other specified disorders of bone density and structure, left thigh: Secondary | ICD-10-CM | POA: Diagnosis not present

## 2021-01-20 ENCOUNTER — Ambulatory Visit
Admission: RE | Admit: 2021-01-20 | Discharge: 2021-01-20 | Disposition: A | Discharge status: Home / Self Care | Payer: Medicare Other | Source: Ambulatory Visit | Attending: Family Medicine | Admitting: Family Medicine

## 2021-01-20 ENCOUNTER — Ambulatory Visit: Payer: Medicare Other

## 2021-01-20 ENCOUNTER — Other Ambulatory Visit: Payer: Self-pay

## 2021-01-20 DIAGNOSIS — Z1231 Encounter for screening mammogram for malignant neoplasm of breast: Secondary | ICD-10-CM | POA: Diagnosis not present

## 2021-02-27 DIAGNOSIS — Z23 Encounter for immunization: Secondary | ICD-10-CM | POA: Diagnosis not present

## 2021-03-03 DIAGNOSIS — J301 Allergic rhinitis due to pollen: Secondary | ICD-10-CM | POA: Diagnosis not present

## 2021-03-03 DIAGNOSIS — J3089 Other allergic rhinitis: Secondary | ICD-10-CM | POA: Diagnosis not present

## 2021-03-03 DIAGNOSIS — J300 Vasomotor rhinitis: Secondary | ICD-10-CM | POA: Diagnosis not present

## 2021-03-24 DIAGNOSIS — H40013 Open angle with borderline findings, low risk, bilateral: Secondary | ICD-10-CM | POA: Diagnosis not present

## 2021-03-24 DIAGNOSIS — H35033 Hypertensive retinopathy, bilateral: Secondary | ICD-10-CM | POA: Diagnosis not present

## 2021-04-01 DIAGNOSIS — Z23 Encounter for immunization: Secondary | ICD-10-CM | POA: Diagnosis not present

## 2021-05-14 IMAGING — MG DIGITAL SCREENING BILATERAL MAMMOGRAM WITH TOMO AND CAD
8 series · 8 of 24 positions shown · non-contrast
Comparison: Previous exam(s).

CLINICAL DATA: Screening.

EXAM:
DIGITAL SCREENING BILATERAL MAMMOGRAM WITH TOMO AND CAD

[L CC synth-2D]
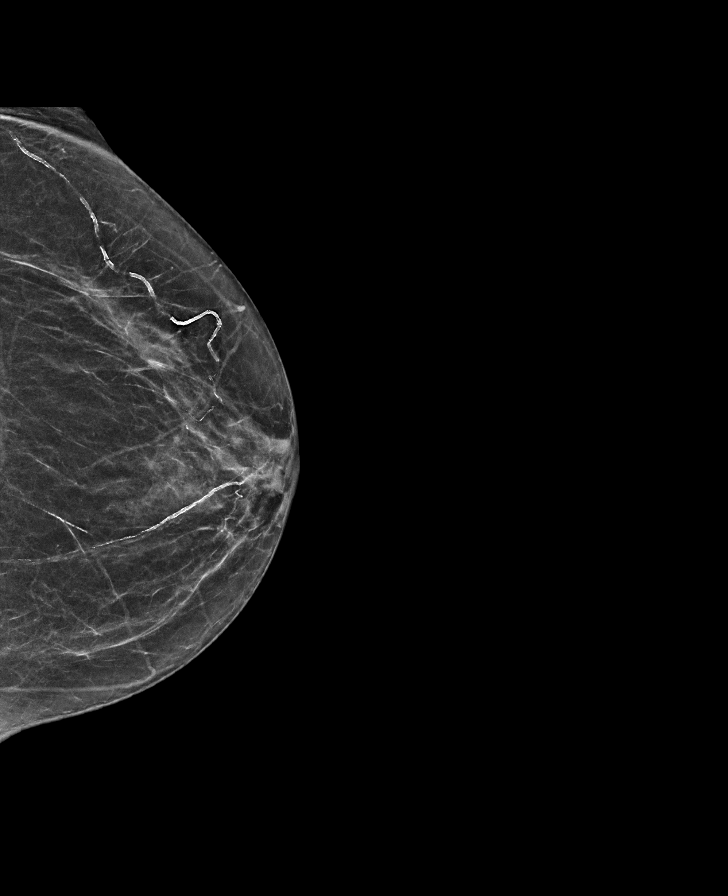

[L MLO synth-2D]
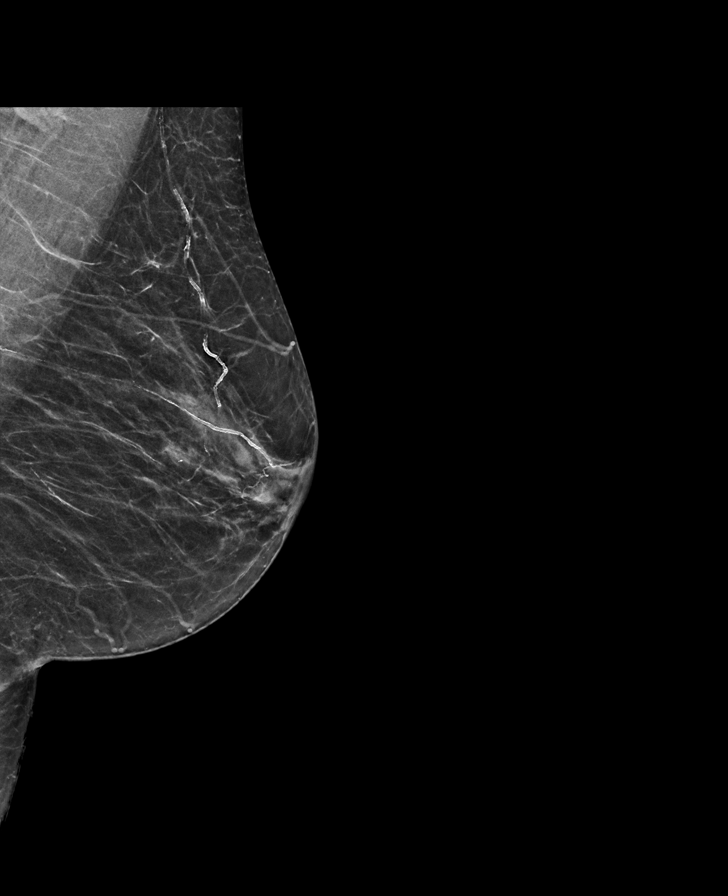

[R MLO synth-2D]
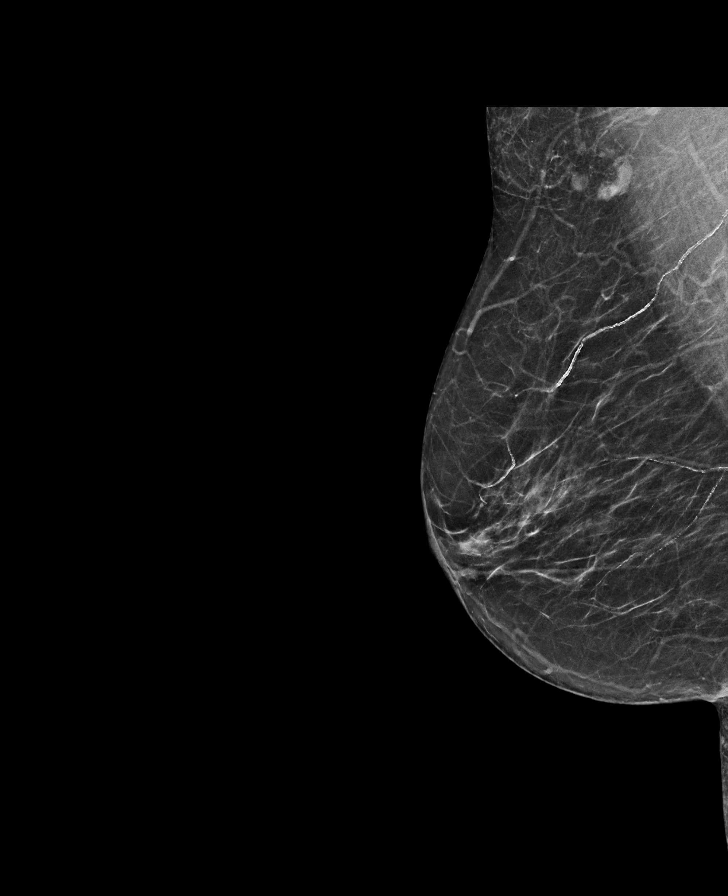

[R CC synth-2D]
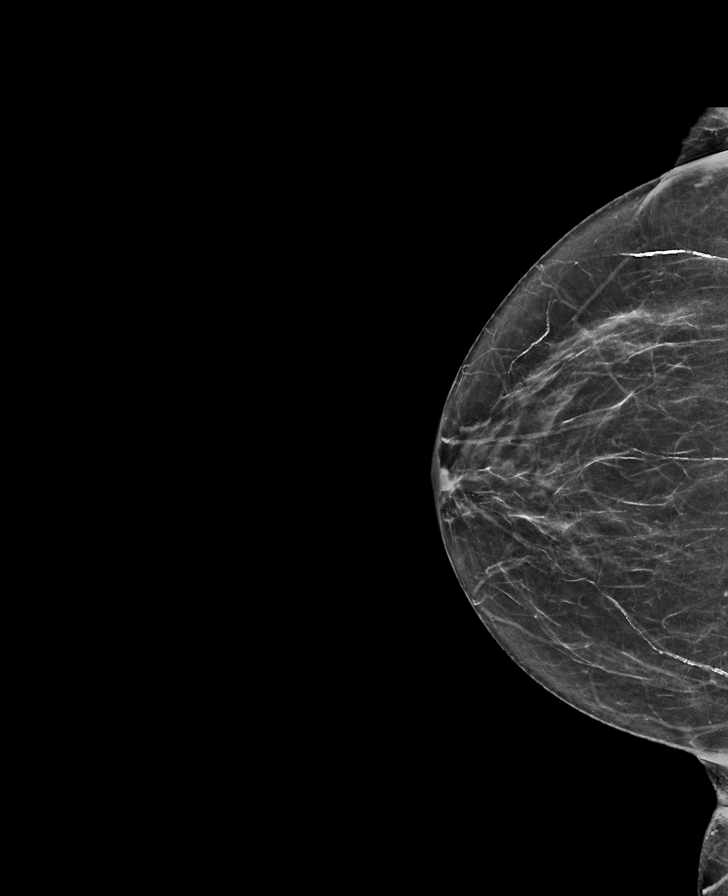

[L CC tomo · tomo slice 27/52.0]
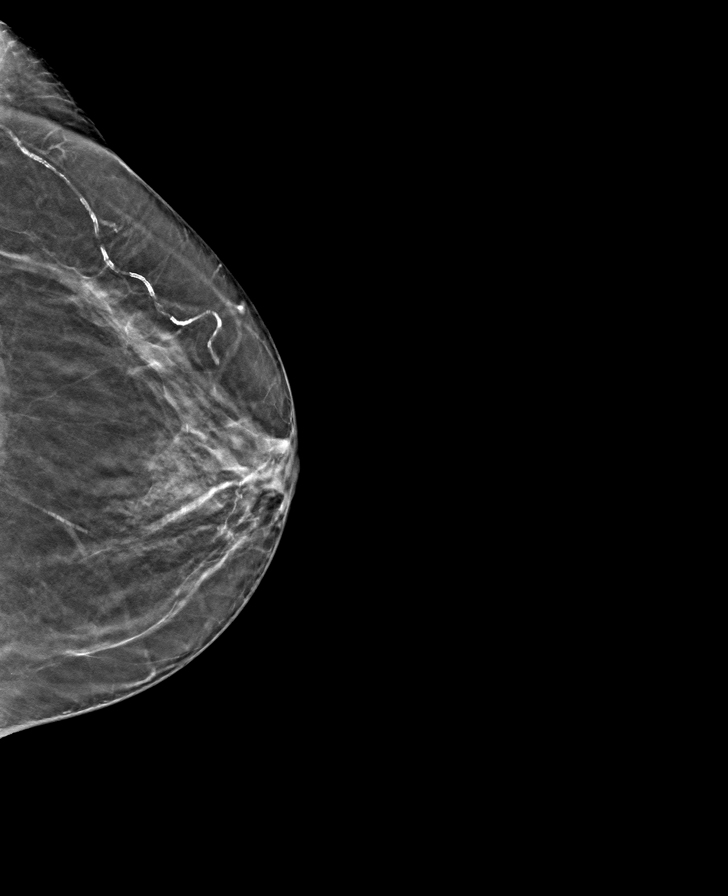

[L MLO tomo · tomo slice 27/54.0]
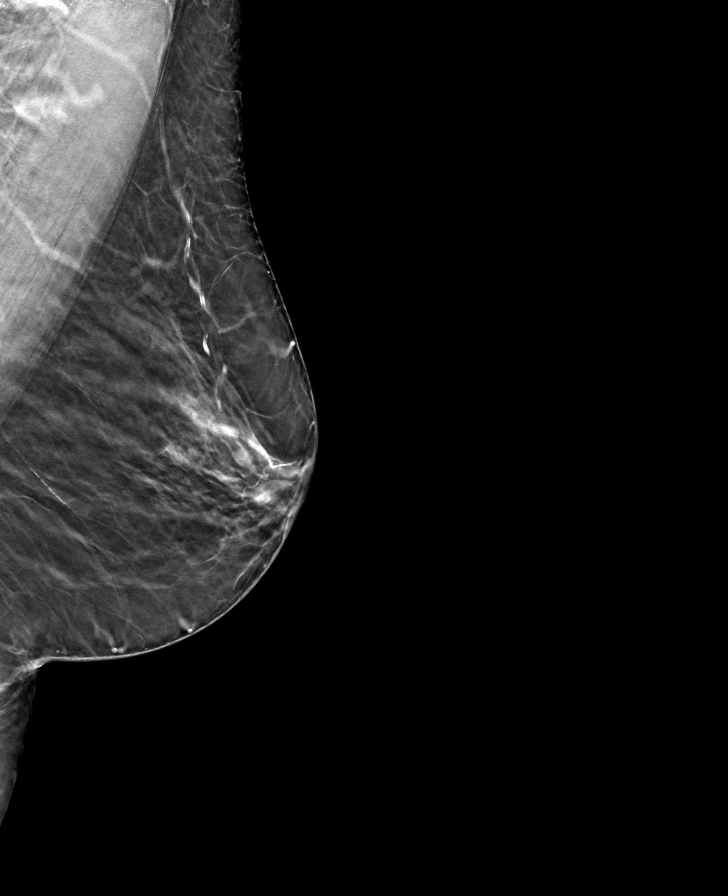

[R MLO tomo · tomo slice 29/58.0]
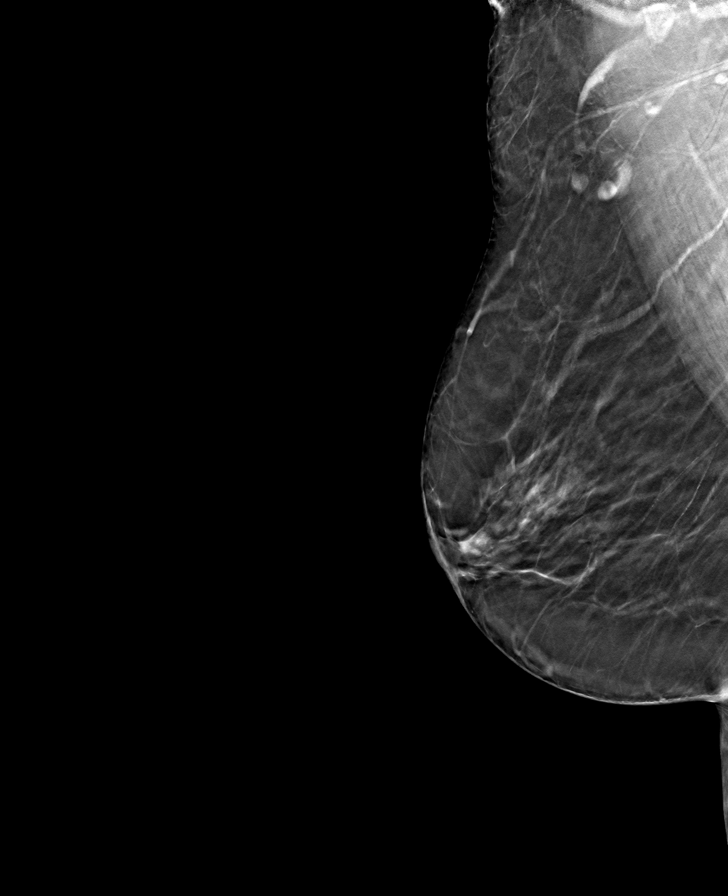

[R CC tomo · tomo slice 27/54.0]
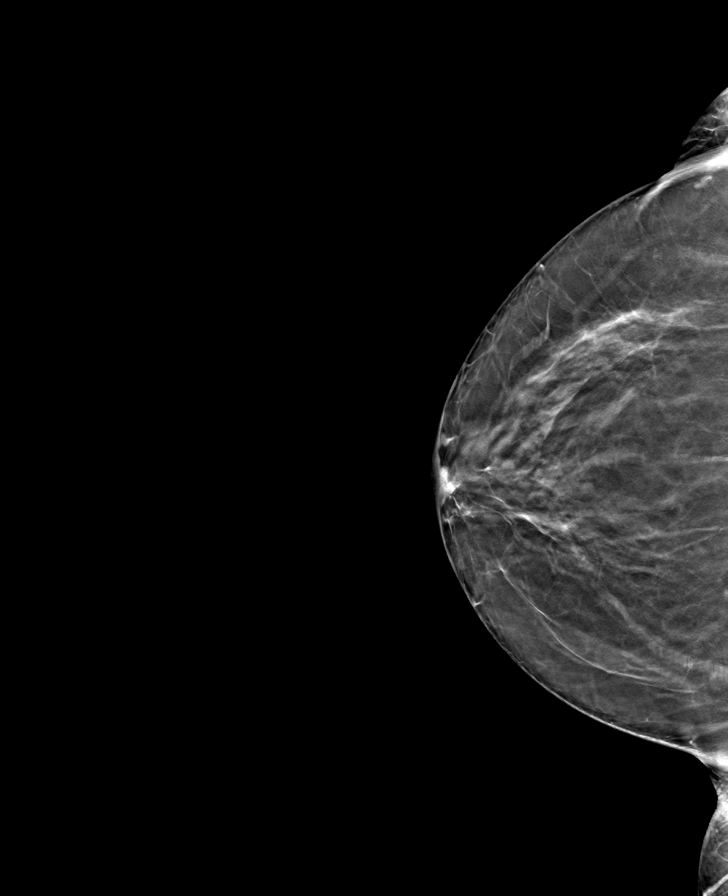

[8 of 24 positions shown; findings below may reference images not displayed]

ACR Breast Density Category b: There are scattered areas of
fibroglandular density.
FINDINGS: There are no findings suspicious for malignancy. Images were
processed with CAD.
IMPRESSION: No mammographic evidence of malignancy. A result letter of this
screening mammogram will be mailed directly to the patient.

RECOMMENDATION:
Screening mammogram in one year. (Code:[id number])

BI-RADS CATEGORY  1: Negative.

## 2021-05-19 DIAGNOSIS — R350 Frequency of micturition: Secondary | ICD-10-CM | POA: Diagnosis not present

## 2021-05-19 DIAGNOSIS — E785 Hyperlipidemia, unspecified: Secondary | ICD-10-CM | POA: Diagnosis not present

## 2021-05-19 DIAGNOSIS — M48061 Spinal stenosis, lumbar region without neurogenic claudication: Secondary | ICD-10-CM | POA: Diagnosis not present

## 2021-06-22 DIAGNOSIS — M4726 Other spondylosis with radiculopathy, lumbar region: Secondary | ICD-10-CM | POA: Diagnosis not present

## 2021-06-22 DIAGNOSIS — Z6825 Body mass index (BMI) 25.0-25.9, adult: Secondary | ICD-10-CM | POA: Diagnosis not present

## 2021-06-22 DIAGNOSIS — I10 Essential (primary) hypertension: Secondary | ICD-10-CM | POA: Diagnosis not present

## 2021-07-02 DIAGNOSIS — M4726 Other spondylosis with radiculopathy, lumbar region: Secondary | ICD-10-CM | POA: Diagnosis not present

## 2021-07-02 DIAGNOSIS — M810 Age-related osteoporosis without current pathological fracture: Secondary | ICD-10-CM | POA: Diagnosis not present

## 2021-07-02 DIAGNOSIS — M549 Dorsalgia, unspecified: Secondary | ICD-10-CM | POA: Diagnosis not present

## 2021-07-02 DIAGNOSIS — M5136 Other intervertebral disc degeneration, lumbar region: Secondary | ICD-10-CM | POA: Diagnosis not present

## 2021-07-06 DIAGNOSIS — Z6825 Body mass index (BMI) 25.0-25.9, adult: Secondary | ICD-10-CM | POA: Diagnosis not present

## 2021-07-06 DIAGNOSIS — M5126 Other intervertebral disc displacement, lumbar region: Secondary | ICD-10-CM | POA: Diagnosis not present

## 2021-07-06 DIAGNOSIS — I10 Essential (primary) hypertension: Secondary | ICD-10-CM | POA: Diagnosis not present

## 2021-07-14 DIAGNOSIS — M5416 Radiculopathy, lumbar region: Secondary | ICD-10-CM | POA: Diagnosis not present

## 2021-07-20 DIAGNOSIS — R7303 Prediabetes: Secondary | ICD-10-CM | POA: Diagnosis not present

## 2021-07-20 DIAGNOSIS — E785 Hyperlipidemia, unspecified: Secondary | ICD-10-CM | POA: Diagnosis not present

## 2021-08-11 DIAGNOSIS — M5416 Radiculopathy, lumbar region: Secondary | ICD-10-CM | POA: Diagnosis not present

## 2021-08-17 DIAGNOSIS — H6123 Impacted cerumen, bilateral: Secondary | ICD-10-CM | POA: Diagnosis not present

## 2021-08-17 DIAGNOSIS — K219 Gastro-esophageal reflux disease without esophagitis: Secondary | ICD-10-CM | POA: Diagnosis not present

## 2021-08-17 DIAGNOSIS — J309 Allergic rhinitis, unspecified: Secondary | ICD-10-CM | POA: Diagnosis not present

## 2021-09-08 DIAGNOSIS — M5416 Radiculopathy, lumbar region: Secondary | ICD-10-CM | POA: Diagnosis not present

## 2021-10-07 DIAGNOSIS — M818 Other osteoporosis without current pathological fracture: Secondary | ICD-10-CM | POA: Diagnosis not present

## 2021-10-07 DIAGNOSIS — E559 Vitamin D deficiency, unspecified: Secondary | ICD-10-CM | POA: Diagnosis not present

## 2021-10-07 DIAGNOSIS — Z79899 Other long term (current) drug therapy: Secondary | ICD-10-CM | POA: Diagnosis not present

## 2021-11-04 DIAGNOSIS — M810 Age-related osteoporosis without current pathological fracture: Secondary | ICD-10-CM | POA: Diagnosis not present

## 2021-12-08 DIAGNOSIS — M48061 Spinal stenosis, lumbar region without neurogenic claudication: Secondary | ICD-10-CM | POA: Diagnosis not present

## 2021-12-08 DIAGNOSIS — Z23 Encounter for immunization: Secondary | ICD-10-CM | POA: Diagnosis not present

## 2021-12-08 DIAGNOSIS — D692 Other nonthrombocytopenic purpura: Secondary | ICD-10-CM | POA: Diagnosis not present

## 2021-12-08 DIAGNOSIS — M5416 Radiculopathy, lumbar region: Secondary | ICD-10-CM | POA: Diagnosis not present

## 2021-12-08 DIAGNOSIS — M810 Age-related osteoporosis without current pathological fracture: Secondary | ICD-10-CM | POA: Diagnosis not present

## 2021-12-08 DIAGNOSIS — K219 Gastro-esophageal reflux disease without esophagitis: Secondary | ICD-10-CM | POA: Diagnosis not present

## 2021-12-08 DIAGNOSIS — R7303 Prediabetes: Secondary | ICD-10-CM | POA: Diagnosis not present

## 2021-12-08 DIAGNOSIS — I6523 Occlusion and stenosis of bilateral carotid arteries: Secondary | ICD-10-CM | POA: Diagnosis not present

## 2021-12-08 DIAGNOSIS — E785 Hyperlipidemia, unspecified: Secondary | ICD-10-CM | POA: Diagnosis not present

## 2021-12-08 DIAGNOSIS — I10 Essential (primary) hypertension: Secondary | ICD-10-CM | POA: Diagnosis not present

## 2021-12-08 DIAGNOSIS — D649 Anemia, unspecified: Secondary | ICD-10-CM | POA: Diagnosis not present

## 2021-12-08 DIAGNOSIS — Z0000 Encounter for general adult medical examination without abnormal findings: Secondary | ICD-10-CM | POA: Diagnosis not present

## 2021-12-16 DIAGNOSIS — M818 Other osteoporosis without current pathological fracture: Secondary | ICD-10-CM | POA: Diagnosis not present

## 2021-12-16 DIAGNOSIS — R7303 Prediabetes: Secondary | ICD-10-CM | POA: Diagnosis not present

## 2021-12-16 DIAGNOSIS — Z79899 Other long term (current) drug therapy: Secondary | ICD-10-CM | POA: Diagnosis not present

## 2021-12-16 DIAGNOSIS — E785 Hyperlipidemia, unspecified: Secondary | ICD-10-CM | POA: Diagnosis not present

## 2022-02-09 ENCOUNTER — Other Ambulatory Visit: Payer: Self-pay | Admitting: Family Medicine

## 2022-02-09 DIAGNOSIS — Z1231 Encounter for screening mammogram for malignant neoplasm of breast: Secondary | ICD-10-CM

## 2022-02-22 DIAGNOSIS — J301 Allergic rhinitis due to pollen: Secondary | ICD-10-CM | POA: Diagnosis not present

## 2022-02-22 DIAGNOSIS — J3089 Other allergic rhinitis: Secondary | ICD-10-CM | POA: Diagnosis not present

## 2022-02-22 DIAGNOSIS — R052 Subacute cough: Secondary | ICD-10-CM | POA: Diagnosis not present

## 2022-02-22 DIAGNOSIS — J300 Vasomotor rhinitis: Secondary | ICD-10-CM | POA: Diagnosis not present

## 2022-03-10 ENCOUNTER — Ambulatory Visit
Admission: RE | Admit: 2022-03-10 | Discharge: 2022-03-10 | Disposition: A | Discharge status: Home / Self Care | Payer: Medicare Other | Source: Ambulatory Visit | Attending: Family Medicine | Admitting: Family Medicine

## 2022-03-10 DIAGNOSIS — Z1231 Encounter for screening mammogram for malignant neoplasm of breast: Secondary | ICD-10-CM

## 2022-03-18 DIAGNOSIS — Z23 Encounter for immunization: Secondary | ICD-10-CM | POA: Diagnosis not present

## 2022-03-23 DIAGNOSIS — K648 Other hemorrhoids: Secondary | ICD-10-CM | POA: Diagnosis not present

## 2022-03-23 DIAGNOSIS — K5909 Other constipation: Secondary | ICD-10-CM | POA: Diagnosis not present

## 2022-03-23 DIAGNOSIS — R140 Abdominal distension (gaseous): Secondary | ICD-10-CM | POA: Diagnosis not present

## 2022-03-30 DIAGNOSIS — Z23 Encounter for immunization: Secondary | ICD-10-CM | POA: Diagnosis not present

## 2022-05-05 NOTE — Progress Notes (Signed)
 Assessment   Age-related osteoporosis without current pathological fracture [M81.0]  1. Age-related osteoporosis without current pathological fracture      2. Encounter for medication management      3. Vitamin D deficiency        Plan  Patient has been advised as to the limitations and limited nature of physical exam due to nature of a VIDEO/TELPHONIC visit, the possibility of privacy risk in the use of a video visit, and that the healthcare provider may recommend visiting a healthcare clinic for in-person care and follow up.  Patient is tolerating Prolia without adverse side effects.  She will continue with treatment.  No invasive dental procedures planned.  Vitamin D levels reviewed and stable at 76.4.  We will update calcium levels-with PCP. Pending stable labs, she will obtain Prolia injection in [place] as this is closest for her. Prolia does not have a drug holiday due to increased risk of vertebral fractures with abrupt discontinuation.  Next DEXA scan due to be updated in July, 2024.  She will follow-up in 6 months.  I discussed the following recommendations: appropriate calcium and vitamin D intake (reviewed daily recommended amts), exercise and physical activity (emphasized importance of wt bearing, muscle strengthening & balance activities), falls prevention & personal safety tips. The patient was provided with appropriate educational materials. Emphasized importance of regular dental visits.   Follow up in about 6 months (around 11/08/2022) for F/U Prolia.   I spent 30 minutes with the patient, greater than 50% of the time was spent coordinating care and counseling the patient on DXA results, disease management of osteoporosis, risk factors, healthy bone habits, and medication management.   Subjective    HISTORY OF PRESENT ILLNESS: Jennifer Valenzuela returns for follow-up for osteoporosis treatment.  Her last office visit was dated 10/07/2021 with last Prolia injection  on 11/04/2021.  Osteoporosis treatment was recommended due to lowest T-score of -3.0 to lumbar spine.  Treatment history: Prolia x 1-5/24/2023. Tolerated well. No past Rx treatment.  Date of diagnosis: years ago Prior fracture: no  Last DEXA: 12/24/2020 Lumbar spine L1-L2 0.803 T score -3.0  Right femoral neck 0.717 T score -2.3  Dual femur total mean 0.768 T score -1.9  Impression: Osteoporosis.  High fracture risk.  Prior DEXA: 12/27/2018 Lumbar spine L1-L2 0.827 T score -2.8  Right femoral neck 0.760  T score -2.0  Dual femur total mean 0.803  T score -1.6  Impression: Osteoporosis.  High fracture risk.   Potentially modifiable risk factors:  Calcium: variable amount in diet-[facility]  Vitamin D:  yes, 1000u daily + amount from [facility]  Exercise: yes, walking; housework; yard work  Smoking: no  Alcohol: yes, wine occasionally  Steroid use:  no   HRT use: hyst/menopause @age  51 without HRT use  PPI/GERD: no   Non-modifiable risk factors:  Osteoporosis in relatives: no  Hip fracture in parent:  no   Rheumatoid arthritis:  no   Height loss:   yes, half inch; 5'2     Other illnesses:  yes, vitamin D deficiency   Other history: Recent falls: no  No data recorded    Associated signs and symptoms: Denies any back, hip, thigh or groin pain.  Goes to dentist as needed and denies plans for any upcoming major dental procedures. Dentures.  Other pertinent history includes: None.   ROS:  A complete ROS was reviewed and all other systems are negative.  Review of Systems  A complete ROS was reviewed and  all other systems are negative.  The patient's allergies, current medications, past family history, past medical history, past social history, past surgical history and problem list were reviewed and updated as appropriate.   Current Outpatient Medications  Medication Sig Dispense Refill  . ANUCORT-HC 25 MG suppository Place one suppository (25 mg dose) rectally  2 (two) times daily.    . cefdinir (OMNICEF) 300 mg capsule Take one capsule (300 mg dose) by mouth 2 (two) times daily.    . cetirizine (ZYRTEC) 10 mg tablet Take by mouth.    . Cholecalciferol (VITAMIN D) 50 MCG (2000 UT) tablet 1 tablet    . desloratadine (CLARINEX) 5 MG tablet Take one tablet (5 mg dose) by mouth daily.    . famotidine (PEPCID) 40 MG tablet Take one tablet (40 mg dose) by mouth at bedtime.    . gabapentin (NEURONTIN) 100 mg capsule gabapentin 100 mg capsule  TK 1 C PO BID  AND TK 3 CS AT BEDTIME    . HYDROcodone-acetaminophen (NORCO) 5-325 mg per tablet hydrocodone 5 mg-acetaminophen 325 mg tablet  TK 1 T PO Q 6 H PRN    . hydrocortisone 2.5 % cream hydrocortisone 2.5 % topical cream  1 APPLICATION TO AFFECTED AREA. EXTERNALLY TO RECTUM TWICE A DAY AS NEEDED FOR HEMORRHOIDS.    SABRA lisinopril-hydrochlorothiazide (PRINZIDE,ZESTORETIC) 10-12.5 MG per tablet Take one tablet by mouth daily.    . meloxicam (MOBIC) 15 mg tablet meloxicam 15 mg tablet    . Multiple Vitamin (MULTIVITAMIN) tablet Take one tablet by mouth daily.    SABRA olopatadine HCl (PATADAY) 0.2 % ophthalmic solution olopatadine 0.2 % eye drops    . polyethylene glycol-electrolytes (NULYTELY,TRILYTE,GAVILYTE-N) 420 g solution peg-electrolyte solution 420 gram oral solution  MIX AND DRINK AS DIRECTED    . pravastatin sodium (PRAVACHOL) 40 mg tablet 1 tablet    . pravastatin sodium (PRAVACHOL) 40 mg tablet Take one tablet (40 mg dose) by mouth daily.    . predniSONE (DELTASONE) 10 mg tablet Take by mouth.    . rosuvastatin calcium (CRESTOR) 20 mg tablet Take by mouth.    . rosuvastatin calcium (CRESTOR) 5 mg tablet rosuvastatin 5 mg tablet    . traMADol (ULTRAM) 50 mg tablet tramadol 50 mg tablet  TK 1 T PO BID FOR 5 DAYS PRN     No current facility-administered medications for this visit.    Objective   PHYSICAL EXAM: There were no vitals filed for this visit. There is no height or weight on file to  calculate BMI.  GENERAL: 81 y.o. Caucasian female in no acute/verbal distress.   LABORATORY DATA: Lab Results  Component Value Date   Creatinine 0.96 10/07/2021   Lab Results  Component Value Date   eGFR 60 10/07/2021   Lab Results  Component Value Date   CALCIUM 10.2 10/07/2021   Lab Results  Component Value Date   Vit D, 25-Hydroxy 76.4 10/07/2021    As always I do thank you for allowing me to be involved in your patient's care and will continue to keep you informed as to their status.  Omobolawa Olagoke, DNP, NP-C

## 2022-05-10 DIAGNOSIS — E559 Vitamin D deficiency, unspecified: Secondary | ICD-10-CM | POA: Diagnosis not present

## 2022-05-10 DIAGNOSIS — M810 Age-related osteoporosis without current pathological fracture: Secondary | ICD-10-CM | POA: Diagnosis not present

## 2022-05-10 DIAGNOSIS — Z79899 Other long term (current) drug therapy: Secondary | ICD-10-CM | POA: Diagnosis not present

## 2022-06-02 DIAGNOSIS — M818 Other osteoporosis without current pathological fracture: Secondary | ICD-10-CM | POA: Diagnosis not present

## 2022-06-02 DIAGNOSIS — M48061 Spinal stenosis, lumbar region without neurogenic claudication: Secondary | ICD-10-CM | POA: Diagnosis not present

## 2022-06-02 DIAGNOSIS — M810 Age-related osteoporosis without current pathological fracture: Secondary | ICD-10-CM | POA: Diagnosis not present

## 2022-06-02 DIAGNOSIS — R7303 Prediabetes: Secondary | ICD-10-CM | POA: Diagnosis not present

## 2022-06-02 DIAGNOSIS — T7840XA Allergy, unspecified, initial encounter: Secondary | ICD-10-CM | POA: Diagnosis not present

## 2022-07-06 DIAGNOSIS — M810 Age-related osteoporosis without current pathological fracture: Secondary | ICD-10-CM | POA: Diagnosis not present

## 2022-07-20 DIAGNOSIS — K219 Gastro-esophageal reflux disease without esophagitis: Secondary | ICD-10-CM | POA: Diagnosis not present

## 2022-07-20 DIAGNOSIS — K5900 Constipation, unspecified: Secondary | ICD-10-CM | POA: Diagnosis not present

## 2022-07-20 DIAGNOSIS — K625 Hemorrhage of anus and rectum: Secondary | ICD-10-CM | POA: Diagnosis not present

## 2022-07-20 DIAGNOSIS — K6289 Other specified diseases of anus and rectum: Secondary | ICD-10-CM | POA: Diagnosis not present

## 2022-12-31 DIAGNOSIS — M810 Age-related osteoporosis without current pathological fracture: Secondary | ICD-10-CM | POA: Diagnosis not present

## 2023-01-03 DIAGNOSIS — M1990 Unspecified osteoarthritis, unspecified site: Secondary | ICD-10-CM | POA: Diagnosis not present

## 2023-01-03 DIAGNOSIS — Z0000 Encounter for general adult medical examination without abnormal findings: Secondary | ICD-10-CM | POA: Diagnosis not present

## 2023-01-03 DIAGNOSIS — M818 Other osteoporosis without current pathological fracture: Secondary | ICD-10-CM | POA: Diagnosis not present

## 2023-01-03 DIAGNOSIS — Z79899 Other long term (current) drug therapy: Secondary | ICD-10-CM | POA: Diagnosis not present

## 2023-01-03 DIAGNOSIS — M810 Age-related osteoporosis without current pathological fracture: Secondary | ICD-10-CM | POA: Diagnosis not present

## 2023-01-03 DIAGNOSIS — I10 Essential (primary) hypertension: Secondary | ICD-10-CM | POA: Diagnosis not present

## 2023-01-03 DIAGNOSIS — R7303 Prediabetes: Secondary | ICD-10-CM | POA: Diagnosis not present

## 2023-01-03 DIAGNOSIS — E785 Hyperlipidemia, unspecified: Secondary | ICD-10-CM | POA: Diagnosis not present

## 2023-01-03 DIAGNOSIS — I779 Disorder of arteries and arterioles, unspecified: Secondary | ICD-10-CM | POA: Diagnosis not present

## 2023-01-04 ENCOUNTER — Other Ambulatory Visit: Payer: Self-pay | Admitting: Internal Medicine

## 2023-01-04 DIAGNOSIS — Z13820 Encounter for screening for osteoporosis: Secondary | ICD-10-CM

## 2023-01-11 DIAGNOSIS — M25562 Pain in left knee: Secondary | ICD-10-CM | POA: Diagnosis not present

## 2023-01-11 DIAGNOSIS — M25512 Pain in left shoulder: Secondary | ICD-10-CM | POA: Diagnosis not present

## 2023-01-11 DIAGNOSIS — M25561 Pain in right knee: Secondary | ICD-10-CM | POA: Diagnosis not present

## 2023-01-19 DIAGNOSIS — M6281 Muscle weakness (generalized): Secondary | ICD-10-CM | POA: Diagnosis not present

## 2023-01-19 DIAGNOSIS — M7542 Impingement syndrome of left shoulder: Secondary | ICD-10-CM | POA: Diagnosis not present

## 2023-01-24 DIAGNOSIS — M1711 Unilateral primary osteoarthritis, right knee: Secondary | ICD-10-CM | POA: Diagnosis not present

## 2023-01-24 DIAGNOSIS — M1712 Unilateral primary osteoarthritis, left knee: Secondary | ICD-10-CM | POA: Diagnosis not present

## 2023-01-24 DIAGNOSIS — M6281 Muscle weakness (generalized): Secondary | ICD-10-CM | POA: Diagnosis not present

## 2023-01-24 DIAGNOSIS — R262 Difficulty in walking, not elsewhere classified: Secondary | ICD-10-CM | POA: Diagnosis not present

## 2023-02-04 DIAGNOSIS — K625 Hemorrhage of anus and rectum: Secondary | ICD-10-CM | POA: Diagnosis not present

## 2023-02-15 ENCOUNTER — Other Ambulatory Visit: Payer: Self-pay | Admitting: Family Medicine

## 2023-02-15 DIAGNOSIS — Z1231 Encounter for screening mammogram for malignant neoplasm of breast: Secondary | ICD-10-CM

## 2023-02-22 DIAGNOSIS — M170 Bilateral primary osteoarthritis of knee: Secondary | ICD-10-CM | POA: Diagnosis not present

## 2023-03-07 DIAGNOSIS — Z23 Encounter for immunization: Secondary | ICD-10-CM | POA: Diagnosis not present

## 2023-03-14 DIAGNOSIS — K644 Residual hemorrhoidal skin tags: Secondary | ICD-10-CM | POA: Diagnosis not present

## 2023-03-14 DIAGNOSIS — K648 Other hemorrhoids: Secondary | ICD-10-CM | POA: Diagnosis not present

## 2023-03-14 DIAGNOSIS — K625 Hemorrhage of anus and rectum: Secondary | ICD-10-CM | POA: Diagnosis not present

## 2023-03-15 DEATH — deceased

## 2023-03-17 ENCOUNTER — Ambulatory Visit
Admission: RE | Admit: 2023-03-17 | Discharge: 2023-03-17 | Disposition: A | Discharge status: Home / Self Care | Payer: 59 | Source: Ambulatory Visit | Attending: Family Medicine | Admitting: Family Medicine

## 2023-03-17 DIAGNOSIS — Z1231 Encounter for screening mammogram for malignant neoplasm of breast: Secondary | ICD-10-CM

## 2023-04-21 DIAGNOSIS — M5126 Other intervertebral disc displacement, lumbar region: Secondary | ICD-10-CM | POA: Diagnosis not present

## 2023-04-21 DIAGNOSIS — M4726 Other spondylosis with radiculopathy, lumbar region: Secondary | ICD-10-CM | POA: Diagnosis not present

## 2023-05-19 DIAGNOSIS — M5137 Other intervertebral disc degeneration, lumbosacral region with discogenic back pain only: Secondary | ICD-10-CM | POA: Diagnosis not present

## 2023-05-19 DIAGNOSIS — M4316 Spondylolisthesis, lumbar region: Secondary | ICD-10-CM | POA: Diagnosis not present

## 2023-05-19 DIAGNOSIS — M47816 Spondylosis without myelopathy or radiculopathy, lumbar region: Secondary | ICD-10-CM | POA: Diagnosis not present

## 2023-05-19 DIAGNOSIS — M48061 Spinal stenosis, lumbar region without neurogenic claudication: Secondary | ICD-10-CM | POA: Diagnosis not present

## 2023-05-19 DIAGNOSIS — M5126 Other intervertebral disc displacement, lumbar region: Secondary | ICD-10-CM | POA: Diagnosis not present

## 2023-05-26 DIAGNOSIS — M4316 Spondylolisthesis, lumbar region: Secondary | ICD-10-CM | POA: Diagnosis not present

## 2023-06-22 DIAGNOSIS — M4726 Other spondylosis with radiculopathy, lumbar region: Secondary | ICD-10-CM | POA: Diagnosis not present

## 2023-07-05 DIAGNOSIS — M810 Age-related osteoporosis without current pathological fracture: Secondary | ICD-10-CM | POA: Diagnosis not present

## 2023-07-11 ENCOUNTER — Other Ambulatory Visit: Payer: Medicare Other

## 2023-07-12 DIAGNOSIS — E46 Unspecified protein-calorie malnutrition: Secondary | ICD-10-CM | POA: Diagnosis not present

## 2023-07-12 DIAGNOSIS — R634 Abnormal weight loss: Secondary | ICD-10-CM | POA: Diagnosis not present

## 2023-07-12 DIAGNOSIS — R1012 Left upper quadrant pain: Secondary | ICD-10-CM | POA: Diagnosis not present

## 2023-07-14 ENCOUNTER — Other Ambulatory Visit: Payer: Self-pay | Admitting: Family Medicine

## 2023-07-14 DIAGNOSIS — R634 Abnormal weight loss: Secondary | ICD-10-CM

## 2023-07-14 DIAGNOSIS — M5126 Other intervertebral disc displacement, lumbar region: Secondary | ICD-10-CM | POA: Diagnosis not present

## 2023-07-14 DIAGNOSIS — M4316 Spondylolisthesis, lumbar region: Secondary | ICD-10-CM | POA: Diagnosis not present

## 2023-07-14 DIAGNOSIS — R109 Unspecified abdominal pain: Secondary | ICD-10-CM

## 2023-08-08 DIAGNOSIS — M810 Age-related osteoporosis without current pathological fracture: Secondary | ICD-10-CM | POA: Diagnosis not present

## 2023-08-11 ENCOUNTER — Ambulatory Visit
Admission: RE | Admit: 2023-08-11 | Discharge: 2023-08-11 | Disposition: A | Discharge status: Home / Self Care | Payer: Medicare Other | Source: Ambulatory Visit | Attending: Family Medicine | Admitting: Family Medicine

## 2023-08-11 DIAGNOSIS — R109 Unspecified abdominal pain: Secondary | ICD-10-CM

## 2023-08-11 DIAGNOSIS — R634 Abnormal weight loss: Secondary | ICD-10-CM

## 2023-08-11 MED ORDER — IOPAMIDOL (ISOVUE-300) INJECTION 61%
100.0000 mL | Freq: Once | INTRAVENOUS | Status: AC | PRN
  Administered 2023-08-11: 100 mL via INTRAVENOUS

## 2023-09-12 DIAGNOSIS — I10 Essential (primary) hypertension: Secondary | ICD-10-CM | POA: Diagnosis not present

## 2023-09-12 DIAGNOSIS — E785 Hyperlipidemia, unspecified: Secondary | ICD-10-CM | POA: Diagnosis not present

## 2023-09-12 DIAGNOSIS — M1990 Unspecified osteoarthritis, unspecified site: Secondary | ICD-10-CM | POA: Diagnosis not present

## 2023-09-12 DIAGNOSIS — M810 Age-related osteoporosis without current pathological fracture: Secondary | ICD-10-CM | POA: Diagnosis not present

## 2023-10-12 DIAGNOSIS — I10 Essential (primary) hypertension: Secondary | ICD-10-CM | POA: Diagnosis not present

## 2023-10-12 DIAGNOSIS — E785 Hyperlipidemia, unspecified: Secondary | ICD-10-CM | POA: Diagnosis not present

## 2023-10-12 DIAGNOSIS — M810 Age-related osteoporosis without current pathological fracture: Secondary | ICD-10-CM | POA: Diagnosis not present

## 2023-10-12 DIAGNOSIS — M1990 Unspecified osteoarthritis, unspecified site: Secondary | ICD-10-CM | POA: Diagnosis not present

## 2023-11-12 DIAGNOSIS — M810 Age-related osteoporosis without current pathological fracture: Secondary | ICD-10-CM | POA: Diagnosis not present

## 2023-11-12 DIAGNOSIS — M1990 Unspecified osteoarthritis, unspecified site: Secondary | ICD-10-CM | POA: Diagnosis not present

## 2023-11-12 DIAGNOSIS — I10 Essential (primary) hypertension: Secondary | ICD-10-CM | POA: Diagnosis not present

## 2023-11-12 DIAGNOSIS — E785 Hyperlipidemia, unspecified: Secondary | ICD-10-CM | POA: Diagnosis not present

## 2023-12-12 DIAGNOSIS — E785 Hyperlipidemia, unspecified: Secondary | ICD-10-CM | POA: Diagnosis not present

## 2023-12-12 DIAGNOSIS — M810 Age-related osteoporosis without current pathological fracture: Secondary | ICD-10-CM | POA: Diagnosis not present

## 2023-12-12 DIAGNOSIS — M1990 Unspecified osteoarthritis, unspecified site: Secondary | ICD-10-CM | POA: Diagnosis not present

## 2023-12-12 DIAGNOSIS — I10 Essential (primary) hypertension: Secondary | ICD-10-CM | POA: Diagnosis not present

## 2023-12-29 DIAGNOSIS — H40013 Open angle with borderline findings, low risk, bilateral: Secondary | ICD-10-CM | POA: Diagnosis not present

## 2023-12-29 DIAGNOSIS — H35033 Hypertensive retinopathy, bilateral: Secondary | ICD-10-CM | POA: Diagnosis not present

## 2023-12-29 DIAGNOSIS — H2513 Age-related nuclear cataract, bilateral: Secondary | ICD-10-CM | POA: Diagnosis not present

## 2023-12-30 ENCOUNTER — Other Ambulatory Visit: Payer: Self-pay | Admitting: Internal Medicine

## 2023-12-30 DIAGNOSIS — Z13820 Encounter for screening for osteoporosis: Secondary | ICD-10-CM

## 2024-01-05 DIAGNOSIS — J22 Unspecified acute lower respiratory infection: Secondary | ICD-10-CM | POA: Diagnosis not present

## 2024-01-05 DIAGNOSIS — J0100 Acute maxillary sinusitis, unspecified: Secondary | ICD-10-CM | POA: Diagnosis not present

## 2024-01-09 DIAGNOSIS — Z79899 Other long term (current) drug therapy: Secondary | ICD-10-CM | POA: Diagnosis not present

## 2024-01-09 DIAGNOSIS — E785 Hyperlipidemia, unspecified: Secondary | ICD-10-CM | POA: Diagnosis not present

## 2024-01-09 DIAGNOSIS — R7303 Prediabetes: Secondary | ICD-10-CM | POA: Diagnosis not present

## 2024-01-11 DIAGNOSIS — Z0000 Encounter for general adult medical examination without abnormal findings: Secondary | ICD-10-CM | POA: Diagnosis not present

## 2024-01-11 DIAGNOSIS — Z79899 Other long term (current) drug therapy: Secondary | ICD-10-CM | POA: Diagnosis not present

## 2024-01-11 DIAGNOSIS — M810 Age-related osteoporosis without current pathological fracture: Secondary | ICD-10-CM | POA: Diagnosis not present

## 2024-01-11 DIAGNOSIS — R7303 Prediabetes: Secondary | ICD-10-CM | POA: Diagnosis not present

## 2024-01-11 DIAGNOSIS — E785 Hyperlipidemia, unspecified: Secondary | ICD-10-CM | POA: Diagnosis not present

## 2024-01-11 DIAGNOSIS — R059 Cough, unspecified: Secondary | ICD-10-CM | POA: Diagnosis not present

## 2024-01-11 DIAGNOSIS — Q6100 Congenital renal cyst, unspecified: Secondary | ICD-10-CM | POA: Diagnosis not present

## 2024-01-12 DIAGNOSIS — I10 Essential (primary) hypertension: Secondary | ICD-10-CM | POA: Diagnosis not present

## 2024-01-12 DIAGNOSIS — M1990 Unspecified osteoarthritis, unspecified site: Secondary | ICD-10-CM | POA: Diagnosis not present

## 2024-01-12 DIAGNOSIS — M810 Age-related osteoporosis without current pathological fracture: Secondary | ICD-10-CM | POA: Diagnosis not present

## 2024-01-12 DIAGNOSIS — E785 Hyperlipidemia, unspecified: Secondary | ICD-10-CM | POA: Diagnosis not present

## 2024-01-16 DIAGNOSIS — R0989 Other specified symptoms and signs involving the circulatory and respiratory systems: Secondary | ICD-10-CM | POA: Diagnosis not present

## 2024-01-16 DIAGNOSIS — H6123 Impacted cerumen, bilateral: Secondary | ICD-10-CM | POA: Diagnosis not present

## 2024-01-26 ENCOUNTER — Ambulatory Visit (INDEPENDENT_AMBULATORY_CARE_PROVIDER_SITE_OTHER): Admitting: Physician Assistant

## 2024-01-26 VITALS — BP 159/100 | HR 89

## 2024-01-26 DIAGNOSIS — H6123 Impacted cerumen, bilateral: Secondary | ICD-10-CM | POA: Diagnosis not present

## 2024-01-26 DIAGNOSIS — J301 Allergic rhinitis due to pollen: Secondary | ICD-10-CM

## 2024-01-26 DIAGNOSIS — J309 Allergic rhinitis, unspecified: Secondary | ICD-10-CM

## 2024-01-26 MED ORDER — FLUTICASONE PROPIONATE 50 MCG/ACT NA SUSP: 2.0000 | Freq: Every day | NASAL | 6 refills | Status: AC

## 2024-01-26 NOTE — Progress Notes (Signed)
 Dear Dr. Verena, Here is my assessment for our mutual patient, Jennifer Valenzuela. Thank you for allowing me the opportunity to care for your patient. Please do not hesitate to contact me should you have any other questions. Sincerely, Chyrl Cohen PA-C  Otolaryngology Clinic Note Referring provider: Dr. Verena HPI:  Jennifer Valenzuela is a 83 y.o. female kindly referred by Dr. Verena   The patient is an 83 year old female seen in our office for evaluation of cerumen impaction.  The patient has a significant past medical history of same.  She has approximately 15 years ago she was seeing an ENT who was cleaning her ears.  She notes that recently she has been having her primary care provider nurse to clean her ears, the last time she had them cleaned was approxi-1 year ago.  She notes over the last several months she has had difficulty hearing out of both ears, she notes this is typical when her cerumen becomes impacted.  She also notes that she may have some baseline hearing loss but has not had this diagnosed.  She denies any pain in ears, no drainage, no trauma.  No ear surgeries.  She also notes some issues with her sinus previously with nasal congestion and some phlegm in the back of her throat.  She notes she has a history of seasonal allergies and does take Flonase occasionally as needed but does not take it on a regular basis, she also takes Zyrtec.   Independent Review of Additional Tests or Records:  PCP office visit note 01/18/2024   PMH/Meds/All/SocHx/FamHx/ROS:  No past medical history on file.   No past surgical history on file.  Family History  Problem Relation Age of Onset   Breast cancer Cousin      Social Connections: Unknown (10/14/2021)   Received from [facility]   Social Network    Social Network: Not on file      Current Outpatient Medications:    cetirizine (ZYRTEC) 10 MG chewable tablet, Chew 10 mg by mouth daily., Disp: , Rfl:    cholecalciferol (VITAMIN D3) 25  MCG (1000 UT) tablet, Take 2,000 Units by mouth daily., Disp: , Rfl:    fluticasone (FLONASE) 50 MCG/ACT nasal spray, Place into both nostrils daily., Disp: , Rfl:    gabapentin (NEURONTIN) 100 MG capsule, Take 100 mg by mouth 3 (three) times daily., Disp: , Rfl:    lisinopril (PRINIVIL,ZESTRIL) 10 MG tablet, Take 10 mg by mouth daily., Disp: , Rfl:    Multiple Vitamin (MULTIVITAMIN) tablet, Take 1 tablet by mouth daily., Disp: , Rfl:    rosuvastatin (CRESTOR) 5 MG tablet, Take 5 mg by mouth daily., Disp: , Rfl:    traMADol (ULTRAM) 50 MG tablet, Take 50 mg by mouth every 6 (six) hours as needed., Disp: , Rfl:    Physical Exam:   BP (!) 159/100   Pulse 89   SpO2 98%   Pertinent Findings  CN II-XII intact Bilateral cerumen impaction Weber 512: equal Rinne 512: AC > BC b/l  Anterior rhinoscopy: Septum left deviation ; bilateral inferior turbinates with moderate hypertrophy No lesions of oral cavity/oropharynx; dentition wnl No obviously palpable neck masses/lymphadenopathy/thyromegaly No respiratory distress or stridor     Seprately Identifiable Procedures:  Procedure: Bilateral ear microscopy and cerumen removal using microscope (CPT 30789) - Mod 50 Pre-procedure diagnosis: bilateral cerumen impaction external auditory canals Post-procedure diagnosis: same Indication: bilateral cerumen impaction; given patient's otologic complaints and history as well as for improved and comprehensive examination of  external ear and tympanic membrane, bilateral otologic examination using microscope was performed and impacted cerumen removed  Procedure: Patient was placed semi-recumbent. Both ear canals were examined using the microscope with findings above. Cerumen removed from bilateral external auditory canals using suction and currette with improvement in EAC examination and patency. Left: EAC was patent. TM was intact . Middle ear was aerated. Drainage: none Right: EAC was patent. TM was intact  . Middle ear was aerated . Drainage: none Patient tolerated the procedure well.   Impression & Plans:  Jennifer Valenzuela is a 83 y.o. female with the following   Cerumen impaction-  The patient presented today with cerumen impaction.  This was removed without difficulty.  I see no signs of infection.  The patient will reach out to the office if she develops any new or worsening signs or symptoms.  She does not feel she has any significant change to her baseline hearing but does feel this may be reduced, she would like audiological evaluation.   Allergic rhinitis-  Patient with nasal congestion and some postnasal drainage.  She inconsistently uses Flonase and Zyrtec, if she is having ongoing symptoms I recommend more continuous use of this medication.  She continues to have symptoms despite use of medications I like see her back in the office for this.   - f/u office visit after audiological evaluation   Thank you for allowing me the opportunity to care for your patient. Please do not hesitate to contact me should you have any other questions.  Sincerely, Chyrl Cohen PA-C [facility] Phone: [phone] Fax: [phone]  01/26/2024, 10:45 AM

## 2024-02-12 DIAGNOSIS — M1990 Unspecified osteoarthritis, unspecified site: Secondary | ICD-10-CM | POA: Diagnosis not present

## 2024-02-12 DIAGNOSIS — E785 Hyperlipidemia, unspecified: Secondary | ICD-10-CM | POA: Diagnosis not present

## 2024-02-12 DIAGNOSIS — I10 Essential (primary) hypertension: Secondary | ICD-10-CM | POA: Diagnosis not present

## 2024-02-12 DIAGNOSIS — M810 Age-related osteoporosis without current pathological fracture: Secondary | ICD-10-CM | POA: Diagnosis not present

## 2024-02-28 DIAGNOSIS — Z23 Encounter for immunization: Secondary | ICD-10-CM | POA: Diagnosis not present

## 2024-03-12 ENCOUNTER — Ambulatory Visit (INDEPENDENT_AMBULATORY_CARE_PROVIDER_SITE_OTHER): Admitting: Physician Assistant

## 2024-03-12 VITALS — BP 153/85 | HR 81 | Ht 61.0 in | Wt 127.0 lb

## 2024-03-12 DIAGNOSIS — J309 Allergic rhinitis, unspecified: Secondary | ICD-10-CM

## 2024-03-12 DIAGNOSIS — H9193 Unspecified hearing loss, bilateral: Secondary | ICD-10-CM | POA: Diagnosis not present

## 2024-03-12 NOTE — Progress Notes (Signed)
 Dear Dr. Verena, Here is my assessment for our mutual patient, Jennifer Valenzuela. Thank you for allowing me the opportunity to care for your patient. Please do not hesitate to contact me should you have any other questions. Sincerely, Chyrl Cohen PA-C  Otolaryngology Clinic Note Referring provider: Dr. Verena HPI:  Jennifer Valenzuela is a 83 y.o. female kindly referred by Dr. Verena   The patient is an 83 year old female seen in our office for follow-up evaluation of hearing loss.  The patient was last seen in the office on 01/26/2024, below is a recap of the encounter.  The patient is an 83 year old female seen in our office for evaluation of cerumen impaction.  The patient has a significant past medical history of same.  She has approximately 15 years ago she was seeing an ENT who was cleaning her ears.  She notes that recently she has been having her primary care provider nurse to clean her ears, the last time she had them cleaned was approxi-1 year ago.  She notes over the last several months she has had difficulty hearing out of both ears, she notes this is typical when her cerumen becomes impacted.  She also notes that she may have some baseline hearing loss but has not had this diagnosed.  She denies any pain in ears, no drainage, no trauma.  No ear surgeries.   She also notes some issues with her sinus previously with nasal congestion and some phlegm in the back of her throat.  She notes she has a history of seasonal allergies and does take Flonase  occasionally as needed but does not take it on a regular basis, she also takes Zyrtec  Update 03/12/2024.  Since her last office visit she notes her hearing has significantly proved after removing the cerumen but is not back to a normal level.  She feels she has some baseline hearing loss.  She denies any pain in her ear, no drainage, no recent infection.  She notes some persistent nasal congestion, she continues to Flonase  and Zyrtec as needed which  seems to help her symptoms.      Independent Review of Additional Tests or Records:  None   PMH/Meds/All/SocHx/FamHx/ROS:  No past medical history on file.   No past surgical history on file.  Family History  Problem Relation Age of Onset   Breast cancer Cousin      Social Connections: Unknown (10/14/2021)   Received from [facility]   Social Network    Social Network: Not on file      Current Outpatient Medications:    cetirizine (ZYRTEC) 10 MG chewable tablet, Chew 10 mg by mouth daily., Disp: , Rfl:    cholecalciferol (VITAMIN D3) 25 MCG (1000 UT) tablet, Take 2,000 Units by mouth daily., Disp: , Rfl:    fluticasone  (FLONASE ) 50 MCG/ACT nasal spray, Place into both nostrils daily., Disp: , Rfl:    fluticasone  (FLONASE ) 50 MCG/ACT nasal spray, Place 2 sprays into both nostrils daily., Disp: 16 g, Rfl: 6   gabapentin (NEURONTIN) 100 MG capsule, Take 100 mg by mouth 3 (three) times daily., Disp: , Rfl:    lisinopril (PRINIVIL,ZESTRIL) 10 MG tablet, Take 10 mg by mouth daily., Disp: , Rfl:    Multiple Vitamin (MULTIVITAMIN) tablet, Take 1 tablet by mouth daily., Disp: , Rfl:    rosuvastatin (CRESTOR) 5 MG tablet, Take 5 mg by mouth daily., Disp: , Rfl:    traMADol (ULTRAM) 50 MG tablet, Take 50 mg by mouth every 6 (six)  hours as needed., Disp: , Rfl:    Physical Exam:   BP (!) 153/85   Pulse 81   Ht 5' 1 (1.549 m)   Wt 127 lb (57.6 kg)   SpO2 98%   BMI 24.00 kg/m   Pertinent Findings  CN II-XII intact Bilateral EAC clear and TM intact with well pneumatized middle ear spaces Anterior rhinoscopy: Septum left deviation; bilateral inferior turbinates with moderate hypertrophy No lesions of oral cavity/oropharynx; dentition dentures No obviously palpable neck masses/lymphadenopathy/thyromegaly No respiratory distress or stridor  Seprately Identifiable Procedures:  None  Impression & Plans:  Jennifer Valenzuela is a 83 y.o. female with the following   Hearing  loss-  Bilateral hearing loss, improved after cerumen removal but persistent.  She was supposed to have an audiogram scheduled today, this was not scheduled.  I have recommended audiological evaluation, once this is available I will see her back in the office for repeat evaluation.  Allergic rhinitis-  Continue medical management with Flonase  and Zyrtec as it seems to help her symptoms, continue to monitor.   - f/u office visit with audiological evaluation   Thank you for allowing me the opportunity to care for your patient. Please do not hesitate to contact me should you have any other questions.  Sincerely, Chyrl Cohen PA-C [facility] Phone: [phone] Fax: [phone]  03/12/2024, 9:57 AM

## 2024-03-13 DIAGNOSIS — I10 Essential (primary) hypertension: Secondary | ICD-10-CM | POA: Diagnosis not present

## 2024-03-13 DIAGNOSIS — E785 Hyperlipidemia, unspecified: Secondary | ICD-10-CM | POA: Diagnosis not present

## 2024-03-13 DIAGNOSIS — M810 Age-related osteoporosis without current pathological fracture: Secondary | ICD-10-CM | POA: Diagnosis not present

## 2024-03-13 DIAGNOSIS — M1990 Unspecified osteoarthritis, unspecified site: Secondary | ICD-10-CM | POA: Diagnosis not present

## 2024-03-19 DIAGNOSIS — I10 Essential (primary) hypertension: Secondary | ICD-10-CM | POA: Diagnosis not present

## 2024-03-19 DIAGNOSIS — H2513 Age-related nuclear cataract, bilateral: Secondary | ICD-10-CM | POA: Diagnosis not present

## 2024-03-19 DIAGNOSIS — H2511 Age-related nuclear cataract, right eye: Secondary | ICD-10-CM | POA: Diagnosis not present

## 2024-03-21 ENCOUNTER — Other Ambulatory Visit: Payer: Self-pay | Admitting: Student

## 2024-03-21 DIAGNOSIS — Z1231 Encounter for screening mammogram for malignant neoplasm of breast: Secondary | ICD-10-CM

## 2024-04-04 ENCOUNTER — Ambulatory Visit
Admission: RE | Admit: 2024-04-04 | Discharge: 2024-04-04 | Disposition: A | Discharge status: Home / Self Care | Source: Ambulatory Visit | Attending: Student | Admitting: Student

## 2024-04-04 DIAGNOSIS — Z1231 Encounter for screening mammogram for malignant neoplasm of breast: Secondary | ICD-10-CM | POA: Diagnosis not present

## 2024-04-05 DIAGNOSIS — Z23 Encounter for immunization: Secondary | ICD-10-CM | POA: Diagnosis not present

## 2024-04-13 DIAGNOSIS — M1990 Unspecified osteoarthritis, unspecified site: Secondary | ICD-10-CM | POA: Diagnosis not present

## 2024-04-13 DIAGNOSIS — I10 Essential (primary) hypertension: Secondary | ICD-10-CM | POA: Diagnosis not present

## 2024-04-13 DIAGNOSIS — M810 Age-related osteoporosis without current pathological fracture: Secondary | ICD-10-CM | POA: Diagnosis not present

## 2024-04-13 DIAGNOSIS — E785 Hyperlipidemia, unspecified: Secondary | ICD-10-CM | POA: Diagnosis not present

## 2024-04-20 NOTE — Addendum Note (Signed)
 Addended byBETHA COHEN, Ginger Leeth on: 04/20/2024 11:56 AM   Modules accepted: Orders

## 2024-04-24 ENCOUNTER — Ambulatory Visit (INDEPENDENT_AMBULATORY_CARE_PROVIDER_SITE_OTHER): Admitting: Audiology

## 2024-04-24 ENCOUNTER — Ambulatory Visit (INDEPENDENT_AMBULATORY_CARE_PROVIDER_SITE_OTHER): Admitting: Physician Assistant

## 2024-04-24 ENCOUNTER — Encounter (INDEPENDENT_AMBULATORY_CARE_PROVIDER_SITE_OTHER): Payer: Self-pay | Admitting: Physician Assistant

## 2024-04-24 VITALS — BP 147/76 | HR 67 | Temp 98.2°F

## 2024-04-24 DIAGNOSIS — H903 Sensorineural hearing loss, bilateral: Secondary | ICD-10-CM | POA: Diagnosis not present

## 2024-04-24 DIAGNOSIS — J309 Allergic rhinitis, unspecified: Secondary | ICD-10-CM | POA: Diagnosis not present

## 2024-04-24 NOTE — Progress Notes (Signed)
 Patient is having BP managed. Patient says that she does normally get nervous for appointments. Took BP medication today.

## 2024-04-24 NOTE — Progress Notes (Signed)
 Dear Dr. Verena, Here is my assessment for our mutual patient, Jennifer Valenzuela. Thank you for allowing me the opportunity to care for your patient. Please do not hesitate to contact me should you have any other questions. Sincerely, Chyrl Cohen PA-C  Otolaryngology Clinic Note Referring provider: Dr. Verena HPI:  Jennifer Valenzuela is a 83 y.o. female kindly referred by Dr. Verena   Discussed the use of AI scribe software for clinical note transcription with the patient, who gave verbal consent to proceed.  History of Present Illness   Jennifer Valenzuela is an 83 year old female who presents for evaluation of audiological testing.  She was last seen in the office on 03/12/2024.  At that time she had cerumen, this was removed, hearing was improved but had baseline hearing loss she elected for audiological evaluation.  Since that time she denies any changes to her hearing, no pain.  She has a small, painless bump in her right nose, which she associates with her allergies. Her allergies have worsened this year, causing soreness during flare-ups. She uses antihistamines and Flonase  intermittently, which effectively control her symptoms.  She underwent a hearing test prior to this visit. She does not experience significant difficulty hearing and can participate in normal social functions without trouble. After her ears were cleaned previously, she noticed an improvement in her hearing.           Independent Review of Additional Tests or Records:  Audiological evaluation 03/24/2024   Otoscopy: Right ear: Clear external ear canal and notable landmarks visualized on the tympanic membrane. Left ear:  Clear external ear canal and notable landmarks visualized on the tympanic membrane.   Tympanometry: Right ear: Normal external ear canal volume with normal middle ear pressure and high tympanic membrane compliance (Type Ad). Left ear: Normal external ear canal volume with normal middle ear pressure and  high tympanic membrane compliance (Type Ad).   Hearing Evaluation The hearing test results were completed under headphones and results are deemed to be of good to fair reliability. Test technique:  conventional     Pure tone Audiometry: Right ear- Normal to moderate severe  sensorineural hearing loss from 125 Hz - 8000 Hz. Left ear-  Normal to moderately severe sensorineural hearing loss from 125 Hz - 8000 Hz.   Speech Audiometry: Right ear- Speech Reception Threshold (SRT) was obtained at 30 dBHL. Left ear-Speech Reception Threshold (SRT) was obtained at 30 dBHL.   Word Recognition Score Tested using NU-6 (recorded) Right ear: 96% was obtained at a presentation level of 80 dBHL with contralateral masking which is deemed as  excellent. Left ear: 96% was obtained at a presentation level of 80 dBHL with contralateral masking which is deemed as  excellent.   Impression: There is not a significant difference in pure-tone thresholds between ears.   Recommendations: Follow up with ENT as scheduled for today. Return for a hearing evaluation if concerns with hearing changes arise or per MD recommendation. Consider a communication needs assessment after medical clearance for hearing aids is obtained.  PMH/Meds/All/SocHx/FamHx/ROS:  History reviewed. No pertinent past medical history.   History reviewed. No pertinent surgical history.  Family History  Problem Relation Age of Onset   Breast cancer Cousin      Social Connections: Unknown (10/14/2021)   Received from [facility]   Social Network    Social Network: Not on file      Current Outpatient Medications:    cetirizine (ZYRTEC) 10 MG chewable tablet, Chew 10  mg by mouth daily., Disp: , Rfl:    cholecalciferol (VITAMIN D3) 25 MCG (1000 UT) tablet, Take 2,000 Units by mouth daily., Disp: , Rfl:    fluticasone  (FLONASE ) 50 MCG/ACT nasal spray, Place into both nostrils daily., Disp: , Rfl:    fluticasone  (FLONASE ) 50 MCG/ACT  nasal spray, Place 2 sprays into both nostrils daily., Disp: 16 g, Rfl: 6   gabapentin (NEURONTIN) 100 MG capsule, Take 100 mg by mouth 3 (three) times daily., Disp: , Rfl:    lisinopril (PRINIVIL,ZESTRIL) 10 MG tablet, Take 10 mg by mouth daily., Disp: , Rfl:    Multiple Vitamin (MULTIVITAMIN) tablet, Take 1 tablet by mouth daily., Disp: , Rfl:    rosuvastatin (CRESTOR) 5 MG tablet, Take 5 mg by mouth daily., Disp: , Rfl:    traMADol (ULTRAM) 50 MG tablet, Take 50 mg by mouth every 6 (six) hours as needed., Disp: , Rfl:    Physical Exam:   BP (!) 147/76   Pulse 67   Temp 98.2 F (36.8 C)   SpO2 96%   Pertinent Findings  CN II-XII grossly intact Bilateral EAC clear and TM intact with well pneumatized middle ear spaces Anterior rhinoscopy: Septum left deviation; bilateral inferior turbinates with mild hypertrophy, small flesh-colored swelling noted along the right anterior septum No lesions of oral cavity/oropharynx;  No obviously palpable neck masses/lymphadenopathy/thyromegaly No respiratory distress or stridor        Seprately Identifiable Procedures:  None  Impression & Plans:  [name] is a 83 y.o. female with the following   Assessment and Plan    Bilateral sensorineural hearing loss Symmetric high frequency hearing loss noted. Hearing aids discussed but patient does not feel she needs them at this time. - Follow-up in February for ear cleaning.  Allergic rhinitis with nasal mucosal irritation Nasal mucosal irritation likely due to allergic rhinitis. Small nasal bump likely reactive tissue. No infection or significant concern. - Continue antihistamines and Flonase  as needed. - Avoid rubbing nasal area. - Report worsening symptoms or concerns.           - f/u February office visit   Thank you for allowing me the opportunity to care for your patient. Please do not hesitate to contact me should you have any other questions.  Sincerely, Chyrl Cohen  PA-C [facility] Phone: [phone] Fax: [phone]  04/24/2024, 1:15 PM

## 2024-04-24 NOTE — Progress Notes (Signed)
  [address], [address] [place], [place] [address] [phone]  Audiological Evaluation    Name: Jennifer Valenzuela     DOB:   [birth year]      MRN:   [id number]                                                                                     Service Date: 04/24/2024     Accompanied by: unaccompanied   Patient comes today after [name], PA-C sent a referral for a hearing evaluation due to concerns with hearing loss.   Symptoms Yes Details  Hearing loss  [x]  Left ear decline noted by patient- several; months ago  Tinnitus  []    Ear pain/ infections/pressure  [x]  Right ear pressure, sometimes  Balance problems  []    Noise exposure history  []    Previous ear surgeries  []    Family history of hearing loss  []    Amplification  []    Other  []  Sinus problems    Otoscopy: Right ear: Clear external ear canal and notable landmarks visualized on the tympanic membrane. Left ear:  Clear external ear canal and notable landmarks visualized on the tympanic membrane.  Tympanometry: Right ear: Normal external ear canal volume with normal middle ear pressure and high tympanic membrane compliance (Type Ad). Left ear: Normal external ear canal volume with normal middle ear pressure and high tympanic membrane compliance (Type Ad).  Hearing Evaluation The hearing test results were completed under headphones and results are deemed to be of good to fair reliability. Test technique:  conventional    Pure tone Audiometry: Right ear- Normal to moderate severe  sensorineural hearing loss from 125 Hz - 8000 Hz. Left ear-  Normal to moderately severe sensorineural hearing loss from 125 Hz - 8000 Hz.  Speech Audiometry: Right ear- Speech Reception Threshold (SRT) was obtained at 30 dBHL. Left ear-Speech Reception Threshold (SRT) was obtained at 30 dBHL.   Word Recognition Score Tested using NU-6 (recorded) Right ear: 96% was obtained at a presentation level of 80 dBHL with contralateral  masking which is deemed as  excellent. Left ear: 96% was obtained at a presentation level of 80 dBHL with contralateral masking which is deemed as  excellent.   Impression: There is not a significant difference in pure-tone thresholds between ears.   Recommendations: Follow up with ENT as scheduled for today. Return for a hearing evaluation if concerns with hearing changes arise or per MD recommendation. Consider a communication needs assessment after medical clearance for hearing aids is obtained.   Edrik Rundle MARIE LEROUX-MARTINEZ, AUD

## 2024-05-13 DIAGNOSIS — E785 Hyperlipidemia, unspecified: Secondary | ICD-10-CM | POA: Diagnosis not present

## 2024-05-13 DIAGNOSIS — I10 Essential (primary) hypertension: Secondary | ICD-10-CM | POA: Diagnosis not present

## 2024-05-13 DIAGNOSIS — M810 Age-related osteoporosis without current pathological fracture: Secondary | ICD-10-CM | POA: Diagnosis not present

## 2024-05-13 DIAGNOSIS — M1990 Unspecified osteoarthritis, unspecified site: Secondary | ICD-10-CM | POA: Diagnosis not present

## 2024-05-31 DIAGNOSIS — H2511 Age-related nuclear cataract, right eye: Secondary | ICD-10-CM | POA: Diagnosis not present

## 2024-07-24 ENCOUNTER — Ambulatory Visit (INDEPENDENT_AMBULATORY_CARE_PROVIDER_SITE_OTHER): Admitting: Physician Assistant
